# Patient Record
Sex: Male | Born: 1970 | Race: White | Hispanic: No | Marital: Single | Smoking: Former smoker
Health system: Southern US, Community
[De-identification: ages and names within clinical notes are randomized; demographics above are authoritative.]

## PROBLEM LIST (undated history)

## (undated) DIAGNOSIS — F319 Bipolar disorder, unspecified: Secondary | ICD-10-CM

## (undated) HISTORY — PX: HIATAL HERNIA REPAIR: SHX195

---

## 1999-01-15 ENCOUNTER — Ambulatory Visit (HOSPITAL_COMMUNITY): Admission: RE | Admit: 1999-01-15 | Discharge: 1999-01-15 | Payer: Self-pay | Admitting: Gastroenterology

## 2001-03-01 ENCOUNTER — Inpatient Hospital Stay (HOSPITAL_COMMUNITY): Admission: EM | Admit: 2001-03-01 | Discharge: 2001-03-14 | Payer: Self-pay | Admitting: *Deleted

## 2001-03-06 ENCOUNTER — Encounter: Payer: Self-pay | Admitting: *Deleted

## 2001-03-15 ENCOUNTER — Other Ambulatory Visit (HOSPITAL_COMMUNITY): Admission: RE | Admit: 2001-03-15 | Discharge: 2001-03-23 | Payer: Self-pay | Admitting: Psychiatry

## 2003-05-13 ENCOUNTER — Encounter: Payer: Self-pay | Admitting: Emergency Medicine

## 2003-05-13 ENCOUNTER — Emergency Department (HOSPITAL_COMMUNITY): Admission: EM | Admit: 2003-05-13 | Discharge: 2003-05-13 | Payer: Self-pay | Admitting: Emergency Medicine

## 2003-05-17 ENCOUNTER — Inpatient Hospital Stay (HOSPITAL_COMMUNITY): Admission: EM | Admit: 2003-05-17 | Discharge: 2003-05-21 | Payer: Self-pay | Admitting: Psychiatry

## 2007-07-26 ENCOUNTER — Emergency Department (HOSPITAL_COMMUNITY): Admission: EM | Admit: 2007-07-26 | Discharge: 2007-07-26 | Payer: Self-pay | Admitting: *Deleted

## 2011-02-13 NOTE — Discharge Summary (Signed)
NAME:  Charles Ross, Charles Ross                          ACCOUNT NO.:  [id number]   MEDICAL RECORD NO.:  [id number]                   PATIENT TYPE:  IPS   LOCATION:  0404                                 FACILITY:  [facility]   PHYSICIAN:  Jeanice Lim, M.D.              DATE OF BIRTH:  Oct 28, 1970   DATE OF ADMISSION:  05/17/2003  DATE OF DISCHARGE:  05/21/2003                                 DISCHARGE SUMMARY   IDENTIFYING DATA:  This is a 40 year old single Caucasian male involuntarily  committed with a history of  bipolar disorder with symptoms of mania,  grandiosity, anxious, labile with decreased sleep. He had pressured speech,  inappropriate sexual behavior and flight of ideas requiring emergency  sedation.   ADMISSION MEDICATIONS:  Seroquel, Wellbutrin, lithium in the past.   ALLERGIES:  ASPIRIN.   PHYSICAL EXAMINATION:  Within normal limits. Neurologically nonfocal.   LABORATORY DATA:  Routine admission labs were essentially within normal  limits.   MENTAL STATUS EXAM:  The patient was in bed, sedated. He appeared  to be in  no acute distress. He presented with flight of ideas, pressured speech,  grandiosity, hypersexuality, poor judgment and insight  but no acute  suicidal or homicidal ideation.   ADMISSION DIAGNOSES:   AXIS I:  1. Bipolar disorder, hypomanic to manic phase  2. Polysubstance abuse by history.   AXIS II:  Deferred.   AXIS III:  None.   AXIS IV:  Moderate psychosocial problems related to psychiatric illness.   AXIS V:  20/55 to 60.   HOSPITAL COURSE:  The patient was admitted and ordered routine  p.r.n.  medications. He underwent further monitoring. He was encouraged to  participate in individual, group and milieu therapy. The patient required  Ativan and Haldol p.r.n. IM for initial agitation. He was also place on a  nicotine patch for withdrawal symptoms. He was resumed on psychotropics and  lithium was optimized. The patient was resumed  on Lamictal as  well. His  lithium level  was monitored. The patient reported positive response  to  clinical interventions and medication changes.   His condition on discharge was markedly improved. His mood was more stable,  thought process is goal directed, thought content negative for any dangerous  ideation or psychotic symptoms. His judgment and insight had improved. The  patient reported motivation to be compliant with the follow up plan. The  risk benefit ratio and alternative treatments regarding medications were  discussed and side-effects were reviewed on more than one occasion. The  patient was comfortable with the medication regimen.   DISCHARGE MEDICATIONS:  1. Seroquel 200 mg q. h.s.  2. Protonix 40 mg b.i.d.  3. Lamictal 100 mg q. a.m.  4. Lithium carbonate 300 mg 2 b.i.d.  5. Trazodone 100 mg 1 q. h.s.   FOLLOW UP:  The patient was to follow up with Dr. Lafayette Dragon on May 24, 2003,  at 9:45.   DISCHARGE DIAGNOSES:   AXIS I:  1. Bipolar disorder, hypomanic to manic phase  2. Polysubstance abuse by history.   AXIS II:  Deferred.   AXIS III:  None.   AXIS IV:  Moderate psychosocial problems related to psychiatric illness.   AXIS VKallie Locks, M.D.    JEM/MEDQ  D:  06/14/2003  T:  06/16/2003  Job:  [id number]
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
## 2011-02-13 NOTE — H&P (Signed)
[facility]  Patient:    Charles Ross, Charles Ross                       MRN: [id number] Adm. Date:  [id number] Attending:  Jasmine Pang Dictator:   Candi Leash. Orsini, N.P.                   Psychiatric Admission Assessment  IDENTIFYING INFORMATION:  This is a 40 year old single white male involuntarily committed on March 01, 2001 for bipolar disorder, manic episode. Patient considered to be a danger to himself or others.  HISTORY OF PRESENT ILLNESS:  Patient was petitioned as manic with psychotic features, paranoid, experiencing auditory and visual hallucinations, pressured speech and flight of ideas.  Patient reports feeling very anxious about school.  He reports he has been compliant with his medications.  Has been taking his medications "religiously."  He reports he has been up all night. Recently, he feels nothing but positive kindness towards others.  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  He reports also being "mesmerized" by a male patient here at [facility].  He felt there is some attraction.  He went to ejaculate after seeing her, which helped him sleep.  He reports he would rather be having sex or playing basketball at this point in time.  He denies any hallucinations or changes in his appetite, any depression or suicidal ideation.  Patient is very anxious for discharge.  PAST PSYCHIATRIC HISTORY:  Patient does see a psychiatrist apparently but he was unable to provide that name.  He states he is also due for some group therapy with his parents.  They were to have a visit yesterday.  Patient has had hospitalization, 2-3 at [facility] and at [facility] 3-4 times with last hospitalization apparently six months ago.  Patient has a history of bipolar disorder.  SOCIAL HISTORY:  He is a 40 year old single white male.  He has no children. He lives with his  parents.  He also state sometimes he lives with girlfriend. He has been working at [employer] for about 3-1/2 years.  He does everything, he says, from bartending to cleaning walls.  He has completed an associate degree.  He has got a court date upcoming for possession of cocaine.  FAMILY HISTORY:  None that he is aware.  ALCOHOL/DRUG HISTORY:  He has been smoking for years.  Denies alcohol as a problem.  He smokes marijuana and uses cocaine.  PRIMARY CARE Willis Holquin:  Dr. Lorenz Coaster in [place].  MEDICAL PROBLEMS:  None.  MEDICATIONS:  Lithium carbonate.  Patient takes 1500 mg per day.  He has been on that for 10 years.  Takes 600 mg in the morning and 900 mg at night. Seroquel 100 mg at bedtime.  He states that has been effective for him for sleep and he has been compliant with his medications.  DRUG ALLERGIES:  No known drug allergies.  PHYSICAL EXAMINATION:  Performed at [facility].  We will obtain records. Patient does appear as a healthy, young, adult male without complaints.  MENTAL STATUS EXAMINATION:  He is an alert, young, adult white male.  He is casually dressed.  He has fair eye contact.  Patient does have an assortment of colors on in regards to clothing but clothing is appropriate.  Speech is somewhat pressured.  Patient does curse the F-word  fairly often during the interview.  Mood is euphoric.  His affect is tense and irritable.  Thought processes with no current hallucinations.  Patient does have grandiose ideation, positive delusions, flight of ideas, discussing hypersexual matters, no suicidal or homicidal ideation.  Cognitive function is intact.  Oriented x 3.  His memory is fair.  His judgment is impaired.  His insight is poor.  He appears to have average to above average intelligence.  DIAGNOSES: Axis I:    Bipolar, manic with psychotic features. Axis II:   Deferred. Axis III:  None. Axis IV:   Moderate (problems related to primary support  group, education,            other psychosocial problems related to his psychiatric illness). Axis V:    Current 30; estimated this past year 2.  PLAN:  Involuntary commitment for manic behavior.  Patient delusional and hallucinating.  Contract for safety.  Will monitor him every 15 minutes.  Will resume his lithium and Seroquel.  Add Seroquel b.i.d. for her anxiety and irritability.  We will obtain labs and a lithium level.  Our plan is to improve and stabilize his mood and thinking so patient can be safe and to return him to his prior living arrangement.  TENTATIVE LENGTH OF STAY:  Four to five days. DD:  03/01/01 TD:  03/01/01 Job: [id number]

## 2017-11-03 ENCOUNTER — Ambulatory Visit: Payer: BLUE CROSS/BLUE SHIELD | Admitting: Physician Assistant

## 2017-11-03 ENCOUNTER — Other Ambulatory Visit: Payer: Self-pay

## 2017-11-03 ENCOUNTER — Ambulatory Visit (INDEPENDENT_AMBULATORY_CARE_PROVIDER_SITE_OTHER): Payer: BLUE CROSS/BLUE SHIELD

## 2017-11-03 ENCOUNTER — Encounter: Payer: Self-pay | Admitting: Physician Assistant

## 2017-11-03 VITALS — BP 121/80 | HR 72 | Temp 98.2°F | Resp 16 | Ht 73.0 in | Wt 218.0 lb

## 2017-11-03 DIAGNOSIS — R05 Cough: Secondary | ICD-10-CM | POA: Diagnosis not present

## 2017-11-03 DIAGNOSIS — Z87891 Personal history of nicotine dependence: Secondary | ICD-10-CM

## 2017-11-03 DIAGNOSIS — R062 Wheezing: Secondary | ICD-10-CM

## 2017-11-03 DIAGNOSIS — F17200 Nicotine dependence, unspecified, uncomplicated: Secondary | ICD-10-CM

## 2017-11-03 DIAGNOSIS — R059 Cough, unspecified: Secondary | ICD-10-CM

## 2017-11-03 MED ORDER — IPRATROPIUM BROMIDE 0.02 % IN SOLN
0.5000 mg | Freq: Once | RESPIRATORY_TRACT | Status: AC
  Administered 2017-11-03: 0.5 mg via RESPIRATORY_TRACT

## 2017-11-03 MED ORDER — DOXYCYCLINE HYCLATE 100 MG PO CAPS: 100.0000 mg | ORAL_CAPSULE | Freq: Two times a day (BID) | ORAL | 0 refills | Status: AC

## 2017-11-03 MED ORDER — ALBUTEROL SULFATE (2.5 MG/3ML) 0.083% IN NEBU
2.5000 mg | INHALATION_SOLUTION | Freq: Once | RESPIRATORY_TRACT | Status: AC
  Administered 2017-11-03: 2.5 mg via RESPIRATORY_TRACT

## 2017-11-03 MED ORDER — ALBUTEROL SULFATE HFA 108 (90 BASE) MCG/ACT IN AERS: 2.0000 | INHALATION_SPRAY | Freq: Four times a day (QID) | RESPIRATORY_TRACT | 0 refills | Status: AC | PRN

## 2017-11-03 NOTE — Progress Notes (Signed)
11/03/2017 4:58 PM   DOB: 04/20/1971 / MRN: [id number]  SUBJECTIVE:  Charles Ross is a 47 y.o. male presenting for cough, nasal congestion, sore throat.  Symptoms started about 12 days ago.  Today's the first day he is started to feel that he is getting better.  He has a 5-pack-year history of smoking over the last 20 years.  He associates his sinuses, behind his eyes and has been having some maxillary teeth pain mostly on the right side.  He has no allergies on file.   He  has no past medical history on file.    He  reports that he has quit smoking. he has never used smokeless tobacco. He reports that he drinks alcohol. He reports that he does not use drugs. He  has no sexual activity history on file. The patient  has no past surgical history on file.  His family history is not on file.  Review of Systems  Constitutional: Negative for chills, diaphoresis and fever.  Respiratory: Positive for cough. Negative for hemoptysis, sputum production, shortness of breath and wheezing.   Cardiovascular: Negative for chest pain, orthopnea and leg swelling.  Gastrointestinal: Negative for nausea.  Skin: Negative for rash.  Neurological: Negative for dizziness.    The problem list and medications were reviewed and updated by myself where necessary and exist elsewhere in the encounter.   OBJECTIVE:  BP 121/80 (BP Location: Right Arm, Patient Position: Sitting, Cuff Size: Normal)   Pulse 72   Temp 98.2 F (36.8 C) (Oral)   Resp 16   Ht 6\' 1"  (1.854 m)   Wt 218 lb (98.9 kg)   SpO2 96%   BMI 28.76 kg/m   Physical Exam  Constitutional: He appears well-developed. He is active and cooperative.  Non-toxic appearance.  HENT:  Head: Normocephalic and atraumatic.  Right Ear: Tympanic membrane normal.  Left Ear: Tympanic membrane normal.  Nose: Nose normal. Right sinus exhibits no maxillary sinus tenderness and no frontal sinus tenderness. Left sinus exhibits no maxillary sinus tenderness and  no frontal sinus tenderness.  Mouth/Throat: Uvula is midline and oropharynx is clear and moist.  Cardiovascular: Normal rate, regular rhythm, S1 normal, S2 normal, normal heart sounds, intact distal pulses and normal pulses. Exam reveals no gallop and no friction rub.  No murmur heard. Pulmonary/Chest: Effort normal. No stridor. No tachypnea. No respiratory distress. He has no wheezes. He has no rales.  Abdominal: He exhibits no distension.  Musculoskeletal: He exhibits no edema.  Neurological: He is alert.  Skin: Skin is warm and dry. He is not diaphoretic. No pallor.  Vitals reviewed.   No results found for this or any previous visit (from the past 72 hour(s)).  Dg Chest 2 View  Result Date: 11/03/2017 CLINICAL DATA:  Cough and wheezing.  Smoker EXAM: CHEST  2 VIEW COMPARISON:  None. FINDINGS: The heart size and mediastinal contours are within normal limits. Both lungs are clear. The visualized skeletal structures are unremarkable. IMPRESSION: No active cardiopulmonary disease. Electronically Signed   By: Marlan Palau M.D.   On: 11/03/2017 16:39    ASSESSMENT AND PLAN:  Oracio was seen today for uri.  Diagnoses and all orders for this visit:  Wheezing: Given wheezing, history of smoking, prolonged cough associated with what sounds like a virus this is most likely reactive airway versus a very early COPD-like reaction.  This started on doxycycline to cover for an atypical.  I would like to give him a little prednisone  but no labs exist in the system.  I will bring him back in a few weeks for an annual physical.  Smoker -     albuterol (PROVENTIL) (2.5 MG/3ML) 0.083% nebulizer solution 2.5 mg -     ipratropium (ATROVENT) nebulizer solution 0.5 mg -     DG Chest 2 View; Future  Cough -     doxycycline (VIBRAMYCIN) 100 MG capsule; Take 1 capsule (100 mg total) by mouth 2 (two) times daily for 10 days.    The patient is advised to call or return to clinic if he does not see an  improvement in symptoms, or to seek the care of the closest emergency department if he worsens with the above plan.   Deliah BostonMichael Cordney Barstow, MHS, PA-C Primary Care at [facility] 11/03/2017 4:58 PM

## 2017-11-03 NOTE — Patient Instructions (Addendum)
Go ahead and take the albuterol every 4-6 hours as needed.  I started you on doxycycline which is a broad-spectrum antibiotic.  He can take Tylenol 1000 mg every 8 hours for any aches pains and fevers.  Chest x-ray is normal today.  Please come back in a week or 2 to talk about your rash and I would also like for you to formulate a plan to quit smoking.   IF you received an x-ray today, you will receive an invoice from [facility]. Please contact [facility] at [phone] with questions or concerns regarding your invoice.   IF you received labwork today, you will receive an invoice from [facility]. Please contact [facility] at [phone] with questions or concerns regarding your invoice.   Our billing staff will not be able to assist you with questions regarding bills from these companies.  You will be contacted with the lab results as soon as they are available. The fastest way to get your results is to activate your My Chart account. Instructions are located on the last page of this paperwork. If you have not heard from us regarding the results in 2 weeks, please contact this office.

## 2017-11-04 ENCOUNTER — Telehealth: Payer: Self-pay

## 2017-11-04 NOTE — Telephone Encounter (Signed)
Copied from CRM [id number]. Topic: Inquiry >> Nov 04, 2017  2:26 PM Yvonna Alanisobinson, Andra M wrote: Reason for CRM: Patient called upset over his prescription costs. I advised the patient that a message has been sent earlier today. The patient wants Deliah BostonMichael Clark to take care of this medication issue today. I advised the patient that I would request a call back for him today.       Thank You!!!

## 2017-11-04 NOTE — Telephone Encounter (Signed)
Copied from CRM [phone]. Topic: Inquiry >> Nov 04, 2017 12:06 PM Yvonna Alanisobinson, Andra M wrote: Reason for CRM: [place]wood [place]ette [place] [phone]) called on behalf of the patient requesting a cheaper inhaler than the Albuterol (PROVENTIL HFA;VENTOLIN HFA) 108 (90 Base) MCG/ACT inhaler if possible. Patient stated that he cannot not afford Albuterol (PROVENTIL HFA;VENTOLIN HFA) 108 (90 Base) MCG/ACT inhaler as it has a $90.00 co-payment. Lynette [facility] requests a different inhaler for the patient that would be cheaper. Please call the patient today at [phone].       Thank You!!!  >> Nov 04, 2017  1:33 PM Terisa Starraylor, Brittany L wrote: Patient said he contacted [employer] and was on hold for a hour. Please call patient as soon as you change the med.

## 2017-11-04 NOTE — Telephone Encounter (Signed)
Patient presents in clinic to request alternate to albuterol. He is adamant this is resolved before the end of the business day. RN will resolve per patient request.  Phone call to pharmacy. Spoke with pharmacist - requested generic for medication (medication does not need to be dispensed as written per epic order). Should be 20 dollars out of pocket vs 90 dollars.  Phone call to patient. Left detailed message per release scanned today medication alternative has been called in with approximate cost of 20 dollars out of pocket. If patient calls back, please relay this to him.

## 2018-01-28 ENCOUNTER — Other Ambulatory Visit: Payer: Self-pay | Admitting: Physician Assistant

## 2018-01-28 DIAGNOSIS — R059 Cough, unspecified: Secondary | ICD-10-CM

## 2018-01-28 DIAGNOSIS — R05 Cough: Secondary | ICD-10-CM

## 2018-04-08 ENCOUNTER — Emergency Department
Admission: EM | Admit: 2018-04-08 | Discharge: 2018-04-08 | Disposition: A | Discharge status: Home / Self Care | Payer: BLUE CROSS/BLUE SHIELD | Attending: Emergency Medicine | Admitting: Emergency Medicine

## 2018-04-08 ENCOUNTER — Other Ambulatory Visit: Payer: Self-pay

## 2018-04-08 ENCOUNTER — Encounter: Payer: Self-pay | Admitting: Emergency Medicine

## 2018-04-08 ENCOUNTER — Emergency Department: Payer: Self-pay

## 2018-04-08 DIAGNOSIS — Z79899 Other long term (current) drug therapy: Secondary | ICD-10-CM | POA: Insufficient documentation

## 2018-04-08 DIAGNOSIS — K529 Noninfective gastroenteritis and colitis, unspecified: Secondary | ICD-10-CM | POA: Insufficient documentation

## 2018-04-08 DIAGNOSIS — Z87891 Personal history of nicotine dependence: Secondary | ICD-10-CM | POA: Insufficient documentation

## 2018-04-08 DIAGNOSIS — R140 Abdominal distension (gaseous): Secondary | ICD-10-CM | POA: Insufficient documentation

## 2018-04-08 LAB — COMPREHENSIVE METABOLIC PANEL
ALT: 45 U/L — ABNORMAL HIGH (ref 0–44)
AST: 29 U/L (ref 15–41)
Albumin: 4.3 g/dL (ref 3.5–5.0)
Alkaline Phosphatase: 59 U/L (ref 38–126)
Anion gap: 9 (ref 5–15)
BUN: 8 mg/dL (ref 6–20)
CO2: 21 mmol/L — ABNORMAL LOW (ref 22–32)
Calcium: 9.7 mg/dL (ref 8.9–10.3)
Chloride: 107 mmol/L (ref 98–111)
Creatinine, Ser: 0.75 mg/dL (ref 0.61–1.24)
GFR calc Af Amer: >60 mL/min (ref >60)
GFR calc non Af Amer: >60 mL/min (ref >60)
Glucose, Bld: 144 mg/dL — ABNORMAL HIGH (ref 70–99)
Potassium: 3.5 mmol/L (ref 3.5–5.1)
Sodium: 137 mmol/L (ref 135–145)
Total Bilirubin: 0.7 mg/dL (ref 0.3–1.2)
Total Protein: 7.3 g/dL (ref 6.5–8.1)

## 2018-04-08 LAB — CBC
HCT: 37.6 % — ABNORMAL LOW (ref 40.0–52.0)
Hemoglobin: 13.4 g/dL (ref 13.0–18.0)
MCH: 33.4 pg (ref 26.0–34.0)
MCHC: 35.5 g/dL (ref 32.0–36.0)
MCV: 94.2 fL (ref 80.0–100.0)
Platelets: 204 10*3/uL (ref 150–440)
RBC: 4 MIL/uL — ABNORMAL LOW (ref 4.40–5.90)
RDW: 13.5 % (ref 11.5–14.5)
WBC: 5.8 10*3/uL (ref 3.8–10.6)

## 2018-04-08 LAB — URINALYSIS, COMPLETE (UACMP) WITH MICROSCOPIC
Bacteria, UA: NONE SEEN
Bilirubin Urine: NEGATIVE
Glucose, UA: NEGATIVE mg/dL
Hgb urine dipstick: NEGATIVE
Ketones, ur: NEGATIVE mg/dL
Leukocytes, UA: NEGATIVE
Nitrite: NEGATIVE
Protein, ur: NEGATIVE mg/dL
Specific Gravity, Urine: 1.003 — ABNORMAL LOW (ref 1.005–1.030)
Squamous Epithelial / LPF: NONE SEEN (ref 0–5)
pH: 7 (ref 5.0–8.0)

## 2018-04-08 LAB — LIPASE, BLOOD: Lipase: 109 U/L — ABNORMAL HIGH (ref 11–51)

## 2018-04-08 MED ORDER — IOHEXOL 300 MG/ML  SOLN
100.0000 mL | Freq: Once | INTRAMUSCULAR | Status: AC | PRN
  Administered 2018-04-08: 100 mL via INTRAVENOUS

## 2018-04-08 MED ORDER — CIPROFLOXACIN HCL 500 MG PO TABS: 500.0000 mg | ORAL_TABLET | Freq: Two times a day (BID) | ORAL | 0 refills | Status: AC

## 2018-04-08 MED ORDER — SODIUM CHLORIDE 0.9 % IV BOLUS
1000.0000 mL | Freq: Once | INTRAVENOUS | Status: AC
  Administered 2018-04-08: 1000 mL via INTRAVENOUS

## 2018-04-08 MED ORDER — PROCHLORPERAZINE EDISYLATE 10 MG/2ML IJ SOLN
10.0000 mg | Freq: Once | INTRAMUSCULAR | Status: AC
  Administered 2018-04-08: 10 mg via INTRAVENOUS
  Filled 2018-04-08: qty 2

## 2018-04-08 MED ORDER — MIDAZOLAM HCL 5 MG/5ML IJ SOLN
5.0000 mg | Freq: Once | INTRAMUSCULAR | Status: AC
  Administered 2018-04-08: 5 mg via INTRAVENOUS
  Filled 2018-04-08: qty 5

## 2018-04-08 NOTE — Consult Note (Signed)
Patient ID: Charles Ross, male   DOB: 1970/10/06, 47 y.o.   MRN: [id number]  HPI Charles SettleMichael S Freeburg is a 47 y.o. male asked to see in consultation by Dr. Lamont Snowballifenbark.  He reports about 10-day history of diarrhea and abdominal cramping, patient reports some mild intermittent cramping pain that is diffuse nonradiating.Marland Kitchen.  He also reports significant abdominal distention and nausea.  No fevers no chills.  He has been having multiple watery bowel movements for a week to 10 days.  No previous abdominal operations. Including white count and BMP completely normal.  CT scan personally reviewed there is some mildly dilated loops of small bowel with no evidence of transition zone.  No evidence of free air or pneumatosis. Does report some w weakness and dizziness. He is able to  perform more than 4 METS of activity without any shortness of breath or chest pain He was told he had a hiatal hernia at some point time  HPI  History reviewed. No pertinent past medical history. No pertinent fam hx No surgical hx   No family history on file.  Social History Social History   Tobacco Use  . Smoking status: Former Games developermoker  . Smokeless tobacco: Never Used  Substance Use Topics  . Alcohol use: Yes    Comment: occasional  . Drug use: No    Allergies  Allergen Reactions  . Aspirin     No current facility-administered medications for this encounter.    Current Outpatient Medications  Medication Sig Dispense Refill  . albuterol (PROVENTIL HFA;VENTOLIN HFA) 108 (90 Base) MCG/ACT inhaler Inhale 2 puffs into the lungs every 6 (six) hours as needed for wheezing or shortness of breath. 1 Inhaler 0  . buPROPion (WELLBUTRIN XL) 150 MG 24 hr tablet Take 150 mg by mouth daily.    Marland Kitchen. lithium 600 MG capsule Take 600 mg by mouth 2 (two) times daily with a meal.    . omeprazole (PRILOSEC OTC) 20 MG tablet Take 20 mg by mouth 2 (two) times daily.    . QUEtiapine (SEROQUEL XR) 300 MG 24 hr tablet Take 300 mg by mouth at  bedtime.       Review of Systems Full ROS  was asked and was negative except for the information on the HPI  Physical Exam Blood pressure (!) 157/98, pulse 99, temperature 98.4 F (36.9 C), temperature source Oral, resp. rate 20, height 6\' 1"  (1.854 m), weight 99.8 kg (220 lb), SpO2 97 %. CONSTITUTIONAL: NAD EYES: Pupils are equal, round, and reactive to light, Sclera are non-icteric. EARS, NOSE, MOUTH AND THROAT: The oropharynx is clear. The oral mucosa is pink and moist. Hearing is intact to voice. LYMPH NODES:  Lymph nodes in the neck are normal. RESPIRATORY:  Lungs are clear. There is normal respiratory effort, with equal breath sounds bilaterally, and without pathologic use of accessory muscles. CARDIOVASCULAR: Heart is regular without murmurs, gallops, or rubs. GI: The abdomen is  soft, nontender, and distended. There are no palpable masses. There is no hepatosplenomegaly. There are normal bowel sounds in all quadrants. No peritonitis , reducible small UH GU: Rectal deferred.   MUSCULOSKELETAL: Normal muscle strength and tone. No cyanosis or edema.   SKIN: Turgor is good and there are no pathologic skin lesions or ulcers. NEUROLOGIC: Motor and sensation is grossly normal. Cranial nerves are grossly intact. PSYCH:  Oriented to person, place and time. Affect is normal.  Data Reviewed  I have personally reviewed the patient's imaging, laboratory findings and medical  records.    Assessment/ Plan  47 year old with nausea vomiting, diarrhea consistent with enteritis.  No evidence of bowel obstruction.  No need for emergent surgical intervention at this time.  I do recommend crystalloid resuscitation and symptomatic control.  He may be admitted to the medical service if unable to be hydrated by mouth. Findings were discussed with the patient detail and with the ER physician   Sterling Big, MD FACS General Surgeon 04/08/2018, 5:50 PM

## 2018-04-08 NOTE — ED Notes (Signed)
Patient walked outside.  Told police officer amy that he was dizzy, but he would not get in a wheel chair.  He came in with her and sat in waiting room.  In nad.

## 2018-04-08 NOTE — ED Provider Notes (Signed)
[facility] Emergency Department Provider Note  ____________________________________________   First MD Initiated Contact with Patient 04/08/18 1532     (approximate)  I have reviewed the triage vital signs and the nursing notes.   HISTORY  Chief Complaint No chief complaint on file.   HPI Weston SettleMichael S Sustaita is a 47 y.o. male who self presents to the emergency department with roughly 10 days of loose stools and increasing abdominal distention.  His symptoms had gradual onset have been slowly progressive are now moderate severity.  He has moderate severity diffuse cramping abdominal pain worse when defecating improved thereafter.  No nausea or vomiting.  He does have a remote history of a hiatal hernia repair.  He denies recent antibiotic use.  He denies fevers or chills.  He has not left the country recently.  He has never had a colonoscopy.      History reviewed. No pertinent past medical history.  Patient Active Problem List   Diagnosis Date Noted  . Enteritis       Prior to Admission medications   Medication Sig Start Date End Date Taking? Authorizing Provider  albuterol (PROVENTIL HFA;VENTOLIN HFA) 108 (90 Base) MCG/ACT inhaler Inhale 2 puffs into the lungs every 6 (six) hours as needed for wheezing or shortness of breath. 11/03/17  Yes Ofilia Neaslark, Juluis L, PA-C  buPROPion (WELLBUTRIN XL) 150 MG 24 hr tablet Take 150 mg by mouth daily.   Yes [provider]  lithium 600 MG capsule Take 600 mg by mouth 2 (two) times daily with a meal.   Yes [provider]  omeprazole (PRILOSEC OTC) 20 MG tablet Take 20 mg by mouth 2 (two) times daily.   Yes [provider]  QUEtiapine (SEROQUEL XR) 300 MG 24 hr tablet Take 300 mg by mouth at bedtime.   Yes [provider]  ciprofloxacin (CIPRO) 500 MG tablet Take 1 tablet (500 mg total) by mouth 2 (two) times daily for 3 days. 04/08/18 04/11/18  Merrily Brittleifenbark, Taneisha Fuson, MD     Allergies Aspirin  No family history on file.  Social History Social History   Tobacco Use  . Smoking status: Former Games developermoker  . Smokeless tobacco: Never Used  Substance Use Topics  . Alcohol use: Yes    Comment: occasional  . Drug use: No    Review of Systems Constitutional: No fever/chills Eyes: No visual changes. ENT: No sore throat. Cardiovascular: Denies chest pain. Respiratory: Denies shortness of breath. Gastrointestinal: Positive for abdominal pain.  No nausea, no vomiting.  Positive for diarrhea.  No constipation. Genitourinary: Negative for dysuria. Musculoskeletal: Negative for back pain. Skin: Negative for rash. Neurological: Negative for headaches, focal weakness or numbness.   ____________________________________________   PHYSICAL EXAM:  VITAL SIGNS: ED Triage Vitals  Enc Vitals Group     BP 04/08/18 1410 133/88     Pulse Rate 04/08/18 1410 (!) 104     Resp 04/08/18 1410 16     Temp 04/08/18 1410 98.4 F (36.9 C)     Temp Source 04/08/18 1410 Oral     SpO2 04/08/18 1410 96 %     Weight 04/08/18 1408 220 lb (99.8 kg)     Height 04/08/18 1408 6\' 1"  (1.854 m)     Head Circumference --      Peak Flow --      Pain Score 04/08/18 1408 8     Pain Loc --      Pain Edu? --  Excl. in GC? --     Constitutional: Alert and oriented x4 appears obviously uncomfortable nontoxic no diaphoresis speaks focally sentences Eyes: PERRL EOMI. Head: Atraumatic. Nose: No congestion/rhinnorhea. Mouth/Throat: No trismus Neck: No stridor.   Cardiovascular: Normal rate, regular rhythm. Grossly normal heart sounds.  Good peripheral circulation. Respiratory: Normal respiratory effort.  No retractions. Lungs CTAB and moving good air Gastrointestinal: Soft abdomen distended although not tense mild diffuse tenderness no rebound or guarding no peritonitis hyperactive bowel sounds Musculoskeletal: No lower extremity edema   Neurologic:  Normal speech and language.  No gross focal neurologic deficits are appreciated. Skin:  Skin is warm, dry and intact. No rash noted. Psychiatric: Mood and affect are normal. Speech and behavior are normal.    ____________________________________________   DIFFERENTIAL includes but not limited to  Small bowel obstruction, large bowel obstruction, volvulus, infectious diarrhea, inflammatory diarrhea ____________________________________________   LABS (all labs ordered are listed, but only abnormal results are displayed)  Labs Reviewed  LIPASE, BLOOD - Abnormal; Notable for the following components:      Result Value   Lipase 109 (*)    All other components within normal limits  COMPREHENSIVE METABOLIC PANEL - Abnormal; Notable for the following components:   CO2 21 (*)    Glucose, Bld 144 (*)    ALT 45 (*)    All other components within normal limits  CBC - Abnormal; Notable for the following components:   RBC 4.00 (*)    HCT 37.6 (*)    All other components within normal limits  URINALYSIS, COMPLETE (UACMP) WITH MICROSCOPIC - Abnormal; Notable for the following components:   Color, Urine STRAW (*)    APPearance CLEAR (*)    Specific Gravity, Urine 1.003 (*)    All other components within normal limits    Lab work reviewed by me with elevated lipase but not consistent with pancreatitis __________________________________________  EKG   ____________________________________________  RADIOLOGY  CT abdomen pelvis reviewed by me consistent with small bowel obstruction versus enteritis ____________________________________________   PROCEDURES  Procedure(s) performed: no  Procedures  Critical Care performed: no  ____________________________________________   INITIAL IMPRESSION / ASSESSMENT AND PLAN / ED COURSE  Pertinent labs & imaging results that were available during my care of the patient were reviewed by me and considered in my medical decision making (see chart for details).   The  patient arrives obviously uncomfortable appearing with a distended abdomen and diarrhea for 10 days.  He requires a CT scan with IV contrast.  IV fentanyl for pain in the meantime.    ----------------------------------------- 5:37 PM on 04/08/2018 -----------------------------------------  I personally attempted to place the NG tube after Versed anxiolysis and the patient was unable to tolerate.  I placed the NG tube completely down however he subsequently ripped it out and refused replacement.  Dr. Everlene Farrier is currently at bedside and feels this most likely represents enteritis and small bowel obstruction.  I offered the patient inpatient admission versus outpatient management and he would prefer to go home so we will treat him symptomatically with 3 days of ciprofloxacin and refer him to gastroenterology.  He is able to eat and drink.  Strict return precautions have been given and the patient verbalizes understanding and agreement with the plan.  ____________________________________________   FINAL CLINICAL IMPRESSION(S) / ED DIAGNOSES  Final diagnoses:  Enteritis      NEW MEDICATIONS STARTED DURING THIS VISIT:  New Prescriptions   CIPROFLOXACIN (CIPRO) 500 MG TABLET    Take  1 tablet (500 mg total) by mouth 2 (two) times daily for 3 days.     Note:  This document was prepared using Dragon voice recognition software and may include unintentional dictation errors.     Merrily Brittle, MD 04/08/18 1827

## 2018-04-08 NOTE — ED Notes (Signed)
Attempted to place NG tube without success. Amber, RN also attempted without success and Dr. Lamont Snowballifenbark was called into the room to assist, placement was unsuccessful.

## 2018-04-08 NOTE — ED Triage Notes (Signed)
C/O diarrhea x 1 week.  Also c/o heartburn -- has been taking prilosec.  Also c/o abdominal distention x 1 week.  AAOx3.  Skin warm and dry.  NAD

## 2018-04-08 NOTE — Discharge Instructions (Signed)
Please take all of your antibiotics as prescribed and make an appointment to follow-up with the gastroenterologist this coming week for reevaluation.  Make sure you remain well-hydrated and return to the emergency department for any concerns.  It was a pleasure to take care of you today, and thank you for coming to our emergency department.  If you have any questions or concerns before leaving please ask the nurse to grab me and I'm more than happy to go through your aftercare instructions again.  If you were prescribed any opioid pain medication today such as Norco, Vicodin, Percocet, morphine, hydrocodone, or oxycodone please make sure you do not drive when you are taking this medication as it can alter your ability to drive safely.  If you have any concerns once you are home that you are not improving or are in fact getting worse before you can make it to your follow-up appointment, please do not hesitate to call 911 and come back for further evaluation.  Merrily BrittleNeil Elenie Coven, MD  Results for orders placed or performed during the hospital encounter of 04/08/18  Lipase, blood  Result Value Ref Range   Lipase 109 (H) 11 - 51 U/L  Comprehensive metabolic panel  Result Value Ref Range   Sodium 137 135 - 145 mmol/L   Potassium 3.5 3.5 - 5.1 mmol/L   Chloride 107 98 - 111 mmol/L   CO2 21 (L) 22 - 32 mmol/L   Glucose, Bld 144 (H) 70 - 99 mg/dL   BUN 8 6 - 20 mg/dL   Creatinine, Ser 9.560.75 0.61 - 1.24 mg/dL   Calcium 9.7 8.9 - 21.310.3 mg/dL   Total Protein 7.3 6.5 - 8.1 g/dL   Albumin 4.3 3.5 - 5.0 g/dL   AST 29 15 - 41 U/L   ALT 45 (H) 0 - 44 U/L   Alkaline Phosphatase 59 38 - 126 U/L   Total Bilirubin 0.7 0.3 - 1.2 mg/dL   GFR calc non Af Amer >60 >60 mL/min   GFR calc Af Amer >60 >60 mL/min   Anion gap 9 5 - 15  CBC  Result Value Ref Range   WBC 5.8 3.8 - 10.6 K/uL   RBC 4.00 (L) 4.40 - 5.90 MIL/uL   Hemoglobin 13.4 13.0 - 18.0 g/dL   HCT 08.637.6 (L) 57.840.0 - 46.952.0 %   MCV 94.2 80.0 - 100.0 fL   MCH 33.4 26.0 - 34.0 pg   MCHC 35.5 32.0 - 36.0 g/dL   RDW 62.913.5 52.811.5 - 41.314.5 %   Platelets 204 150 - 440 K/uL  Urinalysis, Complete w Microscopic  Result Value Ref Range   Color, Urine STRAW (A) YELLOW   APPearance CLEAR (A) CLEAR   Specific Gravity, Urine 1.003 (L) 1.005 - 1.030   pH 7.0 5.0 - 8.0   Glucose, UA NEGATIVE NEGATIVE mg/dL   Hgb urine dipstick NEGATIVE NEGATIVE   Bilirubin Urine NEGATIVE NEGATIVE   Ketones, ur NEGATIVE NEGATIVE mg/dL   Protein, ur NEGATIVE NEGATIVE mg/dL   Nitrite NEGATIVE NEGATIVE   Leukocytes, UA NEGATIVE NEGATIVE   WBC, UA 0-5 0 - 5 WBC/hpf   Bacteria, UA NONE SEEN NONE SEEN   Squamous Epithelial / LPF NONE SEEN 0 - 5   Mucus PRESENT    Ct Abdomen Pelvis W Contrast  Result Date: 04/08/2018 CLINICAL DATA:  Diarrhea and abdominal pain for 10 days. EXAM: CT ABDOMEN AND PELVIS WITH CONTRAST TECHNIQUE: Multidetector CT imaging of the abdomen and pelvis was performed using the  standard protocol following bolus administration of intravenous contrast. CONTRAST:  OMNIPAQUE IOHEXOL 300 MG/ML  SOLN COMPARISON:  None FINDINGS: Lower chest: No acute abnormality. Hepatobiliary: No focal liver abnormality is seen. No gallstones, gallbladder wall thickening, or biliary dilatation. Pancreas: Unremarkable. Spleen: The spleen measures 13.3 by 5.9 x 13.4 cm (volume = 550 cm^3) Adrenals/Urinary Tract: Normal appearance of the adrenal glands. Bilateral areas of renal cortical scarring are identified, left greater than right. No mass or hydronephrosis noted. The urinary bladder appears normal. Stomach/Bowel: The stomach is normal. The mid small bowel loops are mildly dilated measuring up to 3 cm. Associated small bowel fluid levels are identified. Transition to normal caliber distal small bowel loops noted in the right lower quadrant of the abdomen, image 43/5. The appendix is visualized and is normal. No pathologic dilatation of the colon. Vascular/Lymphatic: Aortic  atherosclerosis. No aneurysm. No abdominal or pelvic adenopathy. Reproductive: Prostate is unremarkable. Other: No free fluid or fluid collections. Musculoskeletal: No acute or significant osseous findings. IMPRESSION: 1. Exam is positive for mildly dilated loops of mid small bowel with air-fluid levels. Findings may represent low-grade small bowel obstruction versus sequelae of enteritis. No evidence for bowel perforation. 2. Splenomegaly. 3.  Aortic Atherosclerosis (ICD10-I70.0). Electronically Signed   By: Signa Kell M.D.   On: 04/08/2018 16:29

## 2018-04-10 ENCOUNTER — Other Ambulatory Visit: Payer: Self-pay

## 2018-04-10 ENCOUNTER — Emergency Department
Admission: EM | Admit: 2018-04-10 | Discharge: 2018-04-10 | Disposition: A | Discharge status: Home / Self Care | Payer: Self-pay | Attending: Emergency Medicine | Admitting: Emergency Medicine

## 2018-04-10 DIAGNOSIS — Z5321 Procedure and treatment not carried out due to patient leaving prior to being seen by health care provider: Secondary | ICD-10-CM | POA: Insufficient documentation

## 2018-04-10 DIAGNOSIS — Z760 Encounter for issue of repeat prescription: Secondary | ICD-10-CM | POA: Insufficient documentation

## 2018-04-10 NOTE — ED Notes (Signed)
See triage note  States he is requesting seroquel    States he has been out for a few days  Not able to sleep  Denies any other sxs'

## 2018-04-10 NOTE — ED Notes (Signed)
Pt not in room - pt left

## 2018-04-10 NOTE — ED Triage Notes (Signed)
Pt states he is out of seroquel to sleep at night. States ran out 4-5 days ago. [facility] usually prescribes it for him. States he has been in the middle of a move. Alert, oriented, ambulatory.

## 2018-04-11 ENCOUNTER — Telehealth: Payer: Self-pay | Admitting: Gastroenterology

## 2018-04-11 NOTE — Telephone Encounter (Signed)
Attempted to call pt to schedule ED fu with Dr. Allegra LaiVanga no vm set up

## 2018-04-21 ENCOUNTER — Telehealth: Payer: Self-pay | Admitting: Gastroenterology

## 2018-04-21 NOTE — Telephone Encounter (Signed)
No vm set up attempted to call pt to schedule Ed fu per note with Dr. Allegra LaiVanga

## 2018-05-23 ENCOUNTER — Encounter (HOSPITAL_COMMUNITY): Payer: Self-pay | Admitting: *Deleted

## 2018-05-23 ENCOUNTER — Emergency Department (HOSPITAL_COMMUNITY)
Admission: EM | Admit: 2018-05-23 | Discharge: 2018-05-24 | Disposition: A | Discharge status: Home / Self Care | Payer: BLUE CROSS/BLUE SHIELD | Attending: Emergency Medicine | Admitting: Emergency Medicine

## 2018-05-23 DIAGNOSIS — Z79899 Other long term (current) drug therapy: Secondary | ICD-10-CM | POA: Diagnosis not present

## 2018-05-23 DIAGNOSIS — G4700 Insomnia, unspecified: Secondary | ICD-10-CM | POA: Diagnosis present

## 2018-05-23 DIAGNOSIS — K529 Noninfective gastroenteritis and colitis, unspecified: Secondary | ICD-10-CM | POA: Diagnosis not present

## 2018-05-23 DIAGNOSIS — Z87891 Personal history of nicotine dependence: Secondary | ICD-10-CM | POA: Diagnosis not present

## 2018-05-23 DIAGNOSIS — F319 Bipolar disorder, unspecified: Secondary | ICD-10-CM | POA: Diagnosis present

## 2018-05-23 DIAGNOSIS — F3010 Manic episode without psychotic symptoms, unspecified: Secondary | ICD-10-CM

## 2018-05-23 DIAGNOSIS — Z818 Family history of other mental and behavioral disorders: Secondary | ICD-10-CM | POA: Diagnosis not present

## 2018-05-23 DIAGNOSIS — F3110 Bipolar disorder, current episode manic without psychotic features, unspecified: Secondary | ICD-10-CM | POA: Diagnosis not present

## 2018-05-23 HISTORY — DX: Bipolar disorder, unspecified: F31.9

## 2018-05-23 LAB — CBC WITH DIFFERENTIAL/PLATELET
Basophils Absolute: 0 10*3/uL (ref 0.0–0.1)
Basophils Relative: 0 %
Eosinophils Absolute: 0.4 10*3/uL (ref 0.0–0.7)
Eosinophils Relative: 5 %
HCT: 41 % (ref 39.0–52.0)
Hemoglobin: 13.9 g/dL (ref 13.0–17.0)
Lymphocytes Relative: 22 %
Lymphs Abs: 1.8 10*3/uL (ref 0.7–4.0)
MCH: 32 pg (ref 26.0–34.0)
MCHC: 33.9 g/dL (ref 30.0–36.0)
MCV: 94.3 fL (ref 78.0–100.0)
Monocytes Absolute: 0.8 10*3/uL (ref 0.1–1.0)
Monocytes Relative: 10 %
Neutro Abs: 5.2 10*3/uL (ref 1.7–7.7)
Neutrophils Relative %: 63 %
Platelets: 252 10*3/uL (ref 150–400)
RBC: 4.35 MIL/uL (ref 4.22–5.81)
RDW: 13 % (ref 11.5–15.5)
WBC: 8.2 10*3/uL (ref 4.0–10.5)

## 2018-05-23 LAB — COMPREHENSIVE METABOLIC PANEL
ALT: 55 U/L — ABNORMAL HIGH (ref 0–44)
AST: 33 U/L (ref 15–41)
Albumin: 4.5 g/dL (ref 3.5–5.0)
Alkaline Phosphatase: 60 U/L (ref 38–126)
Anion gap: 9 (ref 5–15)
BUN: 5 mg/dL — ABNORMAL LOW (ref 6–20)
CO2: 23 mmol/L (ref 22–32)
Calcium: 9.5 mg/dL (ref 8.9–10.3)
Chloride: 109 mmol/L (ref 98–111)
Creatinine, Ser: 0.7 mg/dL (ref 0.61–1.24)
GFR calc Af Amer: >60 mL/min (ref >60)
GFR calc non Af Amer: >60 mL/min (ref >60)
Glucose, Bld: 121 mg/dL — ABNORMAL HIGH (ref 70–99)
Potassium: 3.9 mmol/L (ref 3.5–5.1)
Sodium: 141 mmol/L (ref 135–145)
Total Bilirubin: 0.7 mg/dL (ref 0.3–1.2)
Total Protein: 7 g/dL (ref 6.5–8.1)

## 2018-05-23 LAB — LITHIUM LEVEL: Lithium Lvl: 0.17 mmol/L — ABNORMAL LOW (ref 0.60–1.20)

## 2018-05-23 LAB — RAPID URINE DRUG SCREEN, HOSP PERFORMED
Amphetamines: NOT DETECTED
Barbiturates: NOT DETECTED
Benzodiazepines: NOT DETECTED
Cocaine: NOT DETECTED
Opiates: NOT DETECTED
Tetrahydrocannabinol: POSITIVE — AB

## 2018-05-23 LAB — TSH: TSH: 0.661 u[IU]/mL (ref 0.350–4.500)

## 2018-05-23 LAB — ETHANOL: Alcohol, Ethyl (B): <10 mg/dL (ref <10)

## 2018-05-23 MED ORDER — ZIPRASIDONE MESYLATE 20 MG IM SOLR
20.0000 mg | Freq: Once | INTRAMUSCULAR | Status: AC
  Administered 2018-05-23: 20 mg via INTRAMUSCULAR
  Filled 2018-05-23: qty 20

## 2018-05-23 MED ORDER — LORAZEPAM 1 MG PO TABS
2.0000 mg | ORAL_TABLET | Freq: Once | ORAL | Status: AC
  Administered 2018-05-23: 2 mg via ORAL
  Filled 2018-05-23: qty 2

## 2018-05-23 MED ORDER — OLANZAPINE 5 MG PO TBDP
5.0000 mg | ORAL_TABLET | Freq: Once | ORAL | Status: AC
  Administered 2018-05-23: 5 mg via ORAL
  Filled 2018-05-23: qty 1

## 2018-05-23 MED ORDER — QUETIAPINE FUMARATE ER 300 MG PO TB24
300.0000 mg | ORAL_TABLET | Freq: Every day | ORAL | Status: DC
  Administered 2018-05-23: 300 mg via ORAL
  Filled 2018-05-23: qty 1

## 2018-05-23 MED ORDER — STERILE WATER FOR INJECTION IJ SOLN
INTRAMUSCULAR | Status: AC
  Administered 2018-05-23: 1.2 mL
  Filled 2018-05-23: qty 10

## 2018-05-23 MED ORDER — LITHIUM CARBONATE 300 MG PO CAPS
600.0000 mg | ORAL_CAPSULE | Freq: Two times a day (BID) | ORAL | Status: DC
  Administered 2018-05-24: 600 mg via ORAL
  Filled 2018-05-23: qty 2

## 2018-05-23 MED ORDER — OLANZAPINE 10 MG PO TBDP: 10.0000 mg | ORAL_TABLET | Freq: Once | ORAL | Status: DC

## 2018-05-23 MED ORDER — ASENAPINE MALEATE 5 MG SL SUBL
10.0000 mg | SUBLINGUAL_TABLET | Freq: Two times a day (BID) | SUBLINGUAL | Status: DC
  Administered 2018-05-23 – 2018-05-24 (×2): 10 mg via SUBLINGUAL
  Filled 2018-05-23 (×2): qty 2

## 2018-05-23 NOTE — ED Provider Notes (Addendum)
Patient started to become aggressive towards staff, yelling and cursing.  Attempting to physically intimidate them, acting as though he was going to strike them, and threatening violence against them. I suspect the patient's mania has reached a point to where he is a danger to himself and others and I think involuntary commitment is the best course of action at this time.   Clinical Course as of May 24 1  Mon May 23, 2018  1631 Spoke with Marylene LandAngela, Case Manager, states social work would be better able to help this patient for possibly setting up housing following   [SJ]  2257 RN in psych hold called stating patient is agitated and behaving aggressively.  Yelling and cursing at staff, putting his hands in their faces if he is going to strike them, and attempting to instigate fights with other patients.  Security and police required for patient and staff safety.   [SJ]    Clinical Course User Index [SJ] De BlanchJoy, Shawn C, PA-C     Joy, Shawn C, PA-C 05/23/18 2312    Anselm PancoastJoy, Shawn C, PA-C 05/24/18 0002    Tilden Fossaees, Elizabeth, MD 05/24/18 [phone]
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
## 2018-05-23 NOTE — Care Management Note (Signed)
Case Management Note  CM consulted for homeless issues.  Spoke with Joy, PA who advised that pt is being evaluated by TTS but appears to be very manic.  CM will defer transition of care planning to CSW for homelessness and TTS recommendations.  CM will continue to follow for other needs.  Charles Ross, Charles SandhoffAngela N, RN 05/23/2018, 4:33 PM

## 2018-05-23 NOTE — ED Notes (Signed)
Patient becoming more anxious and agitated saying he cannot sleep.  Talking in a loud voice.  Dr. Madilyn Hookees notified.  MD at bedside assessing patient.  Ativan ordered for agitation.

## 2018-05-23 NOTE — BH Assessment (Signed)
[facility] Assessment Progress Note Case was staffed with Lord DNP who recommended patient be monitored and observed for safety. Patient will be seen by psychiatry in the a.m.        

## 2018-05-23 NOTE — ED Notes (Signed)
Patient comes of his room and starts an argument with patient in room 34. Staff redirects patient back to his room but he continues to try and aggravate the situation. Patient [facility] to [facility]. Encouragement and support provided and safety maintain. Q 15 min safety checks remain in place and video monitoring.

## 2018-05-23 NOTE — ED Notes (Signed)
Harolyn RutherfordShawn Joy, PA at bedside.

## 2018-05-23 NOTE — ED Notes (Signed)
Patient continues to curse at staff and walk in patient rooms. Patient continues to start arguments with patient in room 34. Patient has been escorted to his room several times [facility] officers and continues to come out and disrupt milieu. Shawn, PA informed of patient;s behavior and reports that he will proceed with IVC paper work. Encouragement and support provided and safety maintain. Q 15 min safety checks remain in place and video monitoring.

## 2018-05-23 NOTE — ED Notes (Signed)
Report given to Cynthia,RN.

## 2018-05-23 NOTE — Progress Notes (Signed)
Consult request has been received. CSW attempting to follow up at present time.  CSW notes pt was seen by TTS and is to remain overnight to bee seen by psychiatry.  2nd shift ED CSW will leave handoff for 1st shift ED CSW.  Dorothe PeaJonathan F. Christen Wardrop, LCSW, LCAS, CSI Clinical Social Worker Ph: [phone]
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
## 2018-05-23 NOTE — ED Triage Notes (Signed)
Pt states he is exhausted and needs to sleep. Pt states he has not slept in the past 3 days. Pt states he tried checking into a hotel but his card kept getting rejected. 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 Pt denies HI/SI.

## 2018-05-24 DIAGNOSIS — K529 Noninfective gastroenteritis and colitis, unspecified: Secondary | ICD-10-CM

## 2018-05-24 DIAGNOSIS — Z87891 Personal history of nicotine dependence: Secondary | ICD-10-CM

## 2018-05-24 DIAGNOSIS — Z818 Family history of other mental and behavioral disorders: Secondary | ICD-10-CM

## 2018-05-24 DIAGNOSIS — F319 Bipolar disorder, unspecified: Secondary | ICD-10-CM

## 2018-05-24 MED ORDER — QUETIAPINE FUMARATE 100 MG PO TABS
100.0000 mg | ORAL_TABLET | Freq: Once | ORAL | Status: AC
  Administered 2018-05-24: 100 mg via ORAL
  Filled 2018-05-24: qty 1

## 2018-05-24 MED ORDER — QUETIAPINE FUMARATE ER 300 MG PO TB24: 300.0000 mg | ORAL_TABLET | Freq: Every day | ORAL | 0 refills | Status: DC

## 2018-05-24 MED ORDER — LITHIUM CARBONATE 600 MG PO CAPS: 600.0000 mg | ORAL_CAPSULE | Freq: Two times a day (BID) | ORAL | 0 refills | Status: DC

## 2018-05-24 MED ORDER — BUPROPION HCL ER (XL) 150 MG PO TB24: 150.0000 mg | ORAL_TABLET | Freq: Every day | ORAL | 0 refills | Status: DC

## 2018-05-24 NOTE — ED Notes (Signed)
Patient came out of his room with all of his clothes off. Patient has to be redirected by staff to put his clothes back on.

## 2018-05-24 NOTE — Consult Note (Addendum)
[facility] Psych ED Discharge  05/24/2018 12:27 PM Charles Ross  MRN:  [id number] Principal Problem: Bipolar I disorder [facility]) Discharge Diagnoses:  Patient Active Problem List   Diagnosis Date Noted  . Bipolar I disorder (HCC) [F31.9] 05/24/2018  . Enteritis [K52.9]     Subjective: Patient denies any SI/HI/AVH and contracts for safety. He reports that he was diagnosed in 1993 with Bipolar. He has been homeless for the last 2 weeks or so. He reports his last hospital admission was 4 years ago. He feel she just need to be back on his medications.He reports that Seroquel has worked for him the best and wishes to continue it. He states that he has a car and a credit card that works. He does not feel he needs inpatient, just his medications.   Objective: Patient is pleasant and cooperative. He had some bizarre behavior while on the unit which required behavioral medications, but has been cooperative and appropriate this morning. Patient has been communicating with staff appropriately since I assessed him. He will follow up with outpatient services and continue his medications.  Total Time spent with patient: 30 minutes  Past Psychiatric History: Reports multiple medications, reports previous hospitalizations, Bipolar I.   Past Medical History:  Past Medical History:  Diagnosis Date  . Bipolar 1 disorder [facility])     Past Surgical History:  Procedure Laterality Date  . HIATAL HERNIA REPAIR     Family History: No family history on file. Family Psychiatric  History: Maternal Grandmother- Bipolar, Maternal Grandfather died by suicide Social History:  Social History   Substance and Sexual Activity  Alcohol Use Yes   Comment: occasional     Social History   Substance and Sexual Activity  Drug Use No    Social History   Socioeconomic History  . Marital status: Single    Spouse name: Not on file  . Number of children: Not on file  . Years of education: Not on file  . Highest education  level: Not on file  Occupational History  . Not on file  Social Needs  . Financial resource strain: Not on file  . Food insecurity:    Worry: Not on file    Inability: Not on file  . Transportation needs:    Medical: Not on file    Non-medical: Not on file  Tobacco Use  . Smoking status: Former Games developer  . Smokeless tobacco: Never Used  Substance and Sexual Activity  . Alcohol use: Yes    Comment: occasional  . Drug use: No  . Sexual activity: Not on file  Lifestyle  . Physical activity:    Days per week: Not on file    Minutes per session: Not on file  . Stress: Not on file  Relationships  . Social connections:    Talks on phone: Not on file    Gets together: Not on file    Attends religious service: Not on file    Active member of club or organization: Not on file    Attends meetings of clubs or organizations: Not on file    Relationship status: Not on file  Other Topics Concern  . Not on file  Social History Narrative  . Not on file    Has this patient used any form of tobacco in the last 30 days? (Cigarettes, Smokeless Tobacco, Cigars, and/or Pipes): No  Current Medications: Current Facility-Administered Medications  Medication Dose Route Frequency Provider Last Rate Last Dose  . asenapine (SAPHRIS) sublingual  tablet 10 mg  10 mg Sublingual BID Charm RingsLord, Jamison Y, NP   10 mg at 05/24/18 0825  . lithium carbonate capsule 600 mg  600 mg Oral BID WC Charm RingsLord, Jamison Y, NP   600 mg at 05/24/18 0824  . QUEtiapine (SEROQUEL XR) 24 hr tablet 300 mg  300 mg Oral QHS Charm RingsLord, Jamison Y, NP   300 mg at 05/23/18 2133   Current Outpatient Medications  Medication Sig Dispense Refill  . albuterol (PROVENTIL HFA;VENTOLIN HFA) 108 (90 Base) MCG/ACT inhaler Inhale 2 puffs into the lungs every 6 (six) hours as needed for wheezing or shortness of breath. 1 Inhaler 0  . buPROPion (WELLBUTRIN XL) 150 MG 24 hr tablet Take 150 mg by mouth daily.    Marland Kitchen. lithium 600 MG capsule Take 600 mg by mouth 2  (two) times daily with a meal.    . omeprazole (PRILOSEC OTC) 20 MG tablet Take 20 mg by mouth 2 (two) times daily.    . QUEtiapine (SEROQUEL XR) 300 MG 24 hr tablet Take 300 mg by mouth at bedtime.     PTA Medications:  (Not in a hospital admission)  Musculoskeletal: Strength & Muscle Tone: within normal limits Gait & Station: normal Patient leans: N/A  Psychiatric Specialty Exam: Physical Exam  Nursing note and vitals reviewed. Constitutional: He is oriented to person, place, and time. He appears well-developed and well-nourished.  HENT:  Head: Normocephalic and atraumatic.  Neck: Normal range of motion.  Cardiovascular: Normal rate.  Respiratory: Effort normal.  Musculoskeletal: Normal range of motion.  Neurological: He is alert and oriented to person, place, and time.  Skin: Skin is warm.  Psychiatric: He has a normal mood and affect. His speech is normal and behavior is normal. Judgment and thought content normal.    Review of Systems  Constitutional: Negative.   HENT: Negative.   Eyes: Negative.   Respiratory: Negative.   Cardiovascular: Negative.   Gastrointestinal: Negative.   Genitourinary: Negative.   Musculoskeletal: Negative.   Skin: Negative.   Neurological: Negative.   Endo/Heme/Allergies: Negative.   Psychiatric/Behavioral: Positive for substance abuse. Negative for hallucinations and suicidal ideas.    Blood pressure (!) 150/90, pulse 88, temperature 98.1 F (36.7 C), temperature source Oral, resp. rate 18, SpO2 97 %.There is no height or weight on file to calculate BMI.  General Appearance: Casual  Eye Contact:  Good  Speech:  Clear and Coherent and Normal Rate  Volume:  Normal  Mood:  Anxious and Euthymic  Affect:  Congruent  Thought Process:  Goal Directed and Descriptions of Associations: Intact  Orientation:  Full (Time, Place, and Person)  Thought Content:  WDL  Suicidal Thoughts:  No  Homicidal Thoughts:  No  Memory:  Immediate;    Good Recent;   Good Remote;   Good  Judgement:  Fair  Insight:  Fair  Psychomotor Activity:  Normal  Concentration:  Concentration: Good and Attention Span: Good  Recall:  Good  Fund of Knowledge:  Good  Language:  Good  Akathisia:  No  Handed:  Right  AIMS (if indicated):   N/A  Assets:  Communication Skills Desire for Improvement Financial Resources/Insurance Physical Health Social Support Transportation  ADL's:  Intact  Cognition:  WNL  Sleep:   N/A     Demographic Factors:  Male, Caucasian, Low socioeconomic status and Unemployed  Loss Factors: NA  Historical Factors: Impulsivity  Risk Reduction Factors:   Positive social support  Continued Clinical Symptoms:  Bipolar Disorder:  Mixed State  Cognitive Features That Contribute To Risk:  None    Suicide Risk:  Minimal: No identifiable suicidal ideation.  Patients presenting with no risk factors but with morbid ruminations; may be classified as minimal risk based on the severity of the depressive symptoms    Plan Of Care/Follow-up recommendations:  Other:  follow up at outpatient provider.   Continue prescribed medications   Disposition: Discharge to [facility] Money, FNP 05/24/2018, 12:27 PM   Patient seen face-to-face for psychiatric evaluation, chart reviewed and case discussed with the physician extender and developed treatment plan. Reviewed the information documented and agree with the treatment plan.  Juanetta Beets, DO 05/24/18 5:04 PM

## 2018-05-24 NOTE — ED Notes (Signed)
Pt behavior bizarre, refering to his room as "castle" Disorganized on approach. Encouragement and support provided. Special checks q 15 mins in place for safety, Video monitoring in place. Will continue to monitor.

## 2018-05-24 NOTE — BH Assessment (Signed)
[facility]  Per Juanetta BeetsJacqueline Norman, DO, this pt does not require psychiatric hospitalization at this time.  Pt presents under IVC initiated by EDP Tilden FossaElizabeth Rees, MD, which Dr Sharma CovertNorman has rescinded.  [facility]  Pt is also to be provided with information regarding supportive services for the homeless.  This has been included in pt's discharge instructions.  Pt's nurse, Morrie Sheldonshley, has been notified.  Doylene Canninghomas Broedy Osbourne, MA Triage Specialist [phone]

## 2018-05-24 NOTE — ED Notes (Signed)
Psychiatry team at bedside

## 2018-05-24 NOTE — ED Notes (Signed)
Social work at bedside.  

## 2018-05-24 NOTE — Discharge Instructions (Addendum)
For your mental health needs, you are advised to follow up with [facility].  New and returning patients are seen at their walk-in clinic.  Walk-in hours are Monday - Friday from 8:00 am - 3:00 pm.  Walk-in patients are seen on a first come, first served basis.  Try to arrive as early as possible for he best chance of being seen the same day: ° °     [facility] °     [address] °     [place], [place] [address] °     [phone] ° °For your shelter needs, contact the following service providers: ° °     [facility] (operated by [facility]) °     [address] °     [place], [place] [address] °     [phone] ° °     [facility] °     [address] °     [place], [place] [address] °     [phone] ° °For day shelter and other supportive services for the homeless, contact the [facility] ([facility]): ° °     [facility] °     [address] °     [place], [place] [address] °     [phone] ° °For transitional housing, contact one of the following agencies.  They provide longer term housing than a shelter, but there is an application process: ° °     [facility] °     [facility] °     [address] °     , [place] [address] °     [phone] °

## 2018-05-24 NOTE — ED Notes (Signed)
Pt talking on hallway phone.  

## 2018-05-24 NOTE — ED Notes (Signed)
Pt d/c home per MD order. Discharge summary reviewed with pt, pt verbalizes understanding. RX provided. Pt denies SI/HI/AVH. Personal property returned. Pt signed e-signature. Ambulatory off unit with MHT.

## 2018-05-24 NOTE — ED Notes (Signed)
Patient came out of his room with nothing on but boxers. Staff redirected him back to his room and informed him that he needed to but his clothes  back on before coming in hallway.

## 2018-05-24 NOTE — Progress Notes (Signed)
CSW met with patient at bedside. Patient requested a list of homeless shelters in the area. CSW sat down with patient and went over homeless shelter resource list, [facility] information and food resource list. Per patient, he was living with his parents before but was kicked out and is now homeless. Patient states he doesn't have concerns for food, just wants a place to live. CSW left resource list with patient to review on his own. No further CSW needs at this time. Please reconsult if needs arise.  Ollen Barges, [place] Work Department  [employer]  [phone]

## 2018-05-24 NOTE — ED Notes (Signed)
Patient came out of room again with all his clothes off except he has a yellow sock over his private area. Patient has to be redirected back to his room again to put his clothes on.

## 2018-05-26 ENCOUNTER — Ambulatory Visit (HOSPITAL_COMMUNITY)
Admission: RE | Admit: 2018-05-26 | Discharge: 2018-05-26 | Disposition: A | Discharge status: Home / Self Care | Payer: BLUE CROSS/BLUE SHIELD | Attending: Psychiatry | Admitting: Psychiatry

## 2018-05-26 NOTE — BH Assessment (Signed)
Patient presented for walk-in assessment accompanied by parents. Per father Sundra Aland(Bernie Fitzhenry) patient waited approx. 2 hours then walked outside, "[facility] Danny with security and I walked around building (inside and outside) attempting to locate patient for assessment. Patient only indicated "wanting to be alone" as symptom on walk-in form. Per parents patient denies SI and HI. Parents attempted to call and text patient, no answer.  Parents declined call to police. Offered support and encouragement to parents.

## 2018-05-27 ENCOUNTER — Encounter (HOSPITAL_COMMUNITY): Payer: Self-pay | Admitting: Emergency Medicine

## 2018-05-27 ENCOUNTER — Emergency Department (HOSPITAL_COMMUNITY)
Admission: EM | Admit: 2018-05-27 | Discharge: 2018-05-27 | Disposition: A | Discharge status: Home / Self Care | Payer: BLUE CROSS/BLUE SHIELD | Attending: Emergency Medicine | Admitting: Emergency Medicine

## 2018-05-27 DIAGNOSIS — Z5321 Procedure and treatment not carried out due to patient leaving prior to being seen by health care provider: Secondary | ICD-10-CM | POA: Diagnosis not present

## 2018-05-27 DIAGNOSIS — Z760 Encounter for issue of repeat prescription: Secondary | ICD-10-CM | POA: Diagnosis present

## 2018-05-27 NOTE — ED Notes (Signed)
Pt states that he forgot that he has an appt. And appreciates our help but he has to go. Pt is getting dressed.

## 2018-05-27 NOTE — ED Notes (Signed)
Pt approaches nurses station and states he must leave for an appointment and "he's already being admitted to [facility] anyway". Pt states he "just looked at the clock and realized what time it is, and he must go". States he is thankful for our help, but needs to leave at this time. Denies SI/HI.

## 2018-05-27 NOTE — ED Notes (Signed)
Patient walked back to the Triage doors and tried to enter; redirected by security and advised that he could not enter through there. Patient then walked out, muttering and cursing under his breath.

## 2018-05-27 NOTE — ED Triage Notes (Addendum)
Patient arrived with EMS from street(homeless) , requesting psychiatric evaluation and medications for  his Bipolar disease , he has not taken his medications for several days with flight of ideas / agitation and restlessness. Denies suicidal ideation /no hallucinations. .Marland Kitchen

## 2018-05-27 NOTE — ED Notes (Signed)
When patient was told that he would be going back to a room, patient apologized for his earlier behavior.

## 2018-05-27 NOTE — ED Notes (Signed)
Patient came to [facility] to state that he is upset that other people are getting a room before he does. Patient has been pacing in the lobby. Asked what time he would get a room and was advised that I could not give him a specific time but that we do have multiple discharges coming up. Patient then stated that he "checked-in in the back, I didn't check in with you!" (patient came EMS). I told patient that I understand but he was the last one to check in and there are two people ahead of him. Patient then states "that's unacceptable, I want to speak to the head huancho".  Ivory BroadKelly M., RN advised and came out to speak to patient. Patient marched out of the lobby, with his clothes basket in hand, muttering and complaining. Then he came back in and said "I am still waiting".

## 2019-04-11 ENCOUNTER — Emergency Department (HOSPITAL_COMMUNITY)
Admission: EM | Admit: 2019-04-11 | Discharge: 2019-04-11 | Disposition: A | Discharge status: Home / Self Care | Payer: No Typology Code available for payment source | Attending: Emergency Medicine | Admitting: Emergency Medicine

## 2019-04-11 ENCOUNTER — Encounter (HOSPITAL_COMMUNITY): Payer: Self-pay | Admitting: *Deleted

## 2019-04-11 ENCOUNTER — Other Ambulatory Visit: Payer: Self-pay

## 2019-04-11 DIAGNOSIS — S39012A Strain of muscle, fascia and tendon of lower back, initial encounter: Secondary | ICD-10-CM | POA: Insufficient documentation

## 2019-04-11 DIAGNOSIS — Y9389 Activity, other specified: Secondary | ICD-10-CM | POA: Diagnosis not present

## 2019-04-11 DIAGNOSIS — Z79899 Other long term (current) drug therapy: Secondary | ICD-10-CM | POA: Diagnosis not present

## 2019-04-11 DIAGNOSIS — X500XXA Overexertion from strenuous movement or load, initial encounter: Secondary | ICD-10-CM | POA: Diagnosis not present

## 2019-04-11 DIAGNOSIS — Y929 Unspecified place or not applicable: Secondary | ICD-10-CM | POA: Insufficient documentation

## 2019-04-11 DIAGNOSIS — Z87891 Personal history of nicotine dependence: Secondary | ICD-10-CM | POA: Diagnosis not present

## 2019-04-11 DIAGNOSIS — S3992XA Unspecified injury of lower back, initial encounter: Secondary | ICD-10-CM | POA: Diagnosis present

## 2019-04-11 DIAGNOSIS — Y990 Civilian activity done for income or pay: Secondary | ICD-10-CM | POA: Diagnosis not present

## 2019-04-11 MED ORDER — NAPROXEN 500 MG PO TABS: 500.0000 mg | ORAL_TABLET | Freq: Two times a day (BID) | ORAL | 0 refills | Status: DC | PRN

## 2019-04-11 MED ORDER — METHOCARBAMOL 500 MG PO TABS: 500.0000 mg | ORAL_TABLET | Freq: Two times a day (BID) | ORAL | 0 refills | Status: AC | PRN

## 2019-04-11 NOTE — ED Provider Notes (Signed)
[facility]   CSN: [id number] Arrival date & time: 04/11/19  0236     History   Chief Complaint Chief Complaint  Patient presents with  . Back Pain    HPI Weston SettleMichael S Gunning is a 48 y.o. male.     The history is provided by the patient. No language interpreter was used.  Back Pain Location:  Lumbar spine Radiates to:  Does not radiate Pain severity:  Mild Onset quality:  Sudden Duration:  4 hours Timing:  Constant Progression:  Unchanged Chronicity:  New Context: lifting heavy objects (lifting 40lb bag of kitty litter)   Relieved by:  Ibuprofen Worsened by:  Movement Associated symptoms: no bladder incontinence, no bowel incontinence, no fever, no numbness, no paresthesias, no perianal numbness and no weakness   Risk factors: no hx of cancer and not obese     Past Medical History:  Diagnosis Date  . Bipolar 1 disorder [facility](HCC)     Patient Active Problem List   Diagnosis Date Noted  . Bipolar I disorder (HCC) 05/24/2018  . Enteritis     Past Surgical History:  Procedure Laterality Date  . HIATAL HERNIA REPAIR          Home Medications    Prior to Admission medications   Medication Sig Start Date End Date Taking? Authorizing Provider  albuterol (PROVENTIL HFA;VENTOLIN HFA) 108 (90 Base) MCG/ACT inhaler Inhale 2 puffs into the lungs every 6 (six) hours as needed for wheezing or shortness of breath. 11/03/17   Ofilia Neaslark, Enrigue L, PA-C  buPROPion (WELLBUTRIN XL) 150 MG 24 hr tablet Take 1 tablet (150 mg total) by mouth daily. Mood control 05/24/18   Money, Gerlene Burdockravis B, FNP  lithium 600 MG capsule Take 1 capsule (600 mg total) by mouth 2 (two) times daily with a meal. Mood control 05/24/18   Money, Gerlene Burdockravis B, FNP  methocarbamol (ROBAXIN) 500 MG tablet Take 1 tablet (500 mg total) by mouth every 12 (twelve) hours as needed for muscle spasms. 04/11/19   Antony MaduraHumes, Zelda Reames, PA-C  naproxen (NAPROSYN) 500 MG tablet Take 1 tablet (500  mg total) by mouth every 12 (twelve) hours as needed for mild pain or moderate pain. 04/11/19   Antony MaduraHumes, Danni Shima, PA-C  omeprazole (PRILOSEC OTC) 20 MG tablet Take 20 mg by mouth 2 (two) times daily.    [provider]  QUEtiapine (SEROQUEL XR) 300 MG 24 hr tablet Take 1 tablet (300 mg total) by mouth at bedtime. Mood control 05/24/18   Money, Gerlene Burdockravis B, FNP    Family History History reviewed. No pertinent family history.  Social History Social History   Tobacco Use  . Smoking status: Former Games developermoker  . Smokeless tobacco: Never Used  Substance Use Topics  . Alcohol use: Yes    Comment: occasional  . Drug use: No     Allergies   Aspirin   Review of Systems Review of Systems  Constitutional: Negative for fever.  Gastrointestinal: Negative for bowel incontinence.  Genitourinary: Negative for bladder incontinence.  Musculoskeletal: Positive for back pain.  Neurological: Negative for weakness, numbness and paresthesias.  Ten systems reviewed and are negative for acute change, except as noted in the HPI.    Physical Exam Updated Vital Signs BP 127/78 (BP Location: Right Arm)   Pulse 98   Temp 98.9 F (37.2 C) (Oral)   Resp 18   SpO2 98%   Physical Exam Vitals signs and nursing note reviewed.  Constitutional:  General: He is not in acute distress.    Appearance: He is well-developed. He is not diaphoretic.     Comments: Nontoxic appearing and in NAD  HENT:     Head: Normocephalic and atraumatic.  Eyes:     General: No scleral icterus.    Conjunctiva/sclera: Conjunctivae normal.  Neck:     Musculoskeletal: Normal range of motion.  Cardiovascular:     Rate and Rhythm: Normal rate and regular rhythm.     Pulses: Normal pulses.  Pulmonary:     Effort: Pulmonary effort is normal. No respiratory distress.     Breath sounds: No stridor. No wheezing.     Comments: Respirations even and unlabored Musculoskeletal: Normal range of motion.        General: Tenderness  present.     Lumbar back: He exhibits tenderness and pain (mild). He exhibits no swelling and no deformity.       Back:     Comments: Mild tenderness to the right of the lumbar midline.  No bony deformities, step-offs, crepitus to the lumbosacral midline.  No significant muscle spasm appreciated.  Skin:    General: Skin is warm and dry.     Coloration: Skin is not pale.     Findings: No erythema or rash.  Neurological:     Mental Status: He is alert and oriented to person, place, and time.     Coordination: Coordination normal.  Psychiatric:        Behavior: Behavior normal.      ED Treatments / Results  Labs (all labs ordered are listed, but only abnormal results are displayed) Labs Reviewed - No data to display  EKG None  Radiology No results found.  Procedures Procedures (including critical care time)  Medications Ordered in ED Medications - No data to display   Initial Impression / Assessment and Plan / ED Course  I have reviewed the triage vital signs and the nursing notes.  Pertinent labs & imaging results that were available during my care of the patient were reviewed by me and considered in my medical decision making (see chart for details).        Patient with back pain, onset after lifting a 40 pound bag of kitty litter.  Pain has improved with ibuprofen.  Patient neurovascularly intact on exam.  Ambulatory without difficulty.  No loss of bowel or bladder control.  No concern for cauda equina.  No fever, night sweats, weight loss, h/o cancer, IVDU.  RICE protocol and pain medicine indicated and discussed with patient.  Will give occupational lifting restrictions x 1 week.  Return precautions discussed and provided. Patient discharged in stable condition with no unaddressed concerns.   Final Clinical Impressions(s) / ED Diagnoses   Final diagnoses:  Strain of lumbar region, initial encounter    ED Discharge Orders         Ordered    naproxen (NAPROSYN)  500 MG tablet  Every 12 hours PRN     04/11/19 0447    methocarbamol (ROBAXIN) 500 MG tablet  Every 12 hours PRN     04/11/19 0447           Antonietta Breach, PA-C 04/11/19 0512    Fatima Blank, MD 04/11/19 [phone]

## 2019-04-11 NOTE — ED Triage Notes (Signed)
Pt reports heavy lifting today and now has lower back pain. Ambulatory on arrival.

## 2019-04-11 NOTE — Discharge Instructions (Signed)
Alternate ice and heat to areas of injury 3-4 times per day to limit inflammation and spasm.  Avoid strenuous activity and heavy lifting.  We recommend consistent use of naproxen in addition to Robaxin for muscle spasms. Do not drive or drink alcohol after taking Robaxin as it may make you drowsy and impair your judgment.  We recommend follow-up with a primary care doctor to ensure resolution of symptoms.  Return to the ED for any new or concerning symptoms. 

## 2019-08-23 ENCOUNTER — Emergency Department (HOSPITAL_COMMUNITY): Payer: Self-pay

## 2019-08-23 ENCOUNTER — Emergency Department (HOSPITAL_COMMUNITY)
Admission: EM | Admit: 2019-08-23 | Discharge: 2019-08-23 | Disposition: A | Discharge status: Home / Self Care | Payer: Self-pay | Attending: Emergency Medicine | Admitting: Emergency Medicine

## 2019-08-23 ENCOUNTER — Other Ambulatory Visit: Payer: Self-pay

## 2019-08-23 DIAGNOSIS — M79671 Pain in right foot: Secondary | ICD-10-CM | POA: Insufficient documentation

## 2019-08-23 DIAGNOSIS — Z79899 Other long term (current) drug therapy: Secondary | ICD-10-CM | POA: Insufficient documentation

## 2019-08-23 DIAGNOSIS — Z87891 Personal history of nicotine dependence: Secondary | ICD-10-CM | POA: Insufficient documentation

## 2019-08-23 NOTE — Discharge Instructions (Addendum)
Can wear ace wrap for comfort.  Tylenol (up to 2000mg  daily) or motrin (up to 800mg  3x daily) for pain. Follow-up with the wellness clinic-- can call in the morning for appt. Return here for any new/acute changes.

## 2019-08-23 NOTE — ED Notes (Signed)
Patient verbalized understanding of dc instructions, vss, ambulatory with nad.   

## 2019-08-23 NOTE — ED Provider Notes (Signed)
[place] EMERGENCY DEPARTMENT Provider Note   CSN: [id number] Arrival date & time: 08/23/19  1443     History   Chief Complaint Chief Complaint  Patient presents with  . Foot Pain    right    HPI Charles Ross is a 48 y.o. male.     The history is provided by the patient and medical records.  Foot Pain    48 year old male with history of bipolar disorder, presenting to the ED with right foot pain.  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  States it felt like something popped out of place.  States since then whenever bearing weight on his right foot he has pain.  He denies any numbness or tingling.  He remains ambulatory.  No prior right foot injuries or surgeries.  No intervention prior to arrival.    Past Medical History:  Diagnosis Date  . Bipolar 1 disorder [facility])     Patient Active Problem List   Diagnosis Date Noted  . Bipolar I disorder ([place]) 05/24/2018  . Enteritis     Past Surgical History:  Procedure Laterality Date  . HIATAL HERNIA REPAIR          Home Medications    Prior to Admission medications   Medication Sig Start Date End Date Taking? Authorizing Provider  albuterol (PROVENTIL HFA;VENTOLIN HFA) 108 (90 Base) MCG/ACT inhaler Inhale 2 puffs into the lungs every 6 (six) hours as needed for wheezing or shortness of breath. 11/03/17   Tereasa Coop, PA-C  buPROPion (WELLBUTRIN XL) 150 MG 24 hr tablet Take 1 tablet (150 mg total) by mouth daily. Mood control 05/24/18   Money, Lowry Ram, FNP  lithium 600 MG capsule Take 1 capsule (600 mg total) by mouth 2 (two) times daily with a meal. Mood control 05/24/18   Money, Lowry Ram, FNP  methocarbamol (ROBAXIN) 500 MG tablet Take 1 tablet (500 mg total) by mouth every 12 (twelve) hours as needed for muscle spasms. 04/11/19   Antonietta Breach, PA-C  naproxen (NAPROSYN) 500 MG tablet Take 1 tablet (500 mg total) by  mouth every 12 (twelve) hours as needed for mild pain or moderate pain. 04/11/19   Antonietta Breach, PA-C  omeprazole (PRILOSEC OTC) 20 MG tablet Take 20 mg by mouth 2 (two) times daily.    [provider]  QUEtiapine (SEROQUEL XR) 300 MG 24 hr tablet Take 1 tablet (300 mg total) by mouth at bedtime. Mood control 05/24/18   Money, Lowry Ram, FNP    Family History No family history on file.  Social History Social History   Tobacco Use  . Smoking status: Former Research scientist (life sciences)  . Smokeless tobacco: Never Used  Substance Use Topics  . Alcohol use: Yes    Comment: occasional  . Drug use: No     Allergies   Aspirin   Review of Systems Review of Systems  Musculoskeletal: Positive for arthralgias.  All other systems reviewed and are negative.    Physical Exam Updated Vital Signs BP 130/78 (BP Location: Right Arm)   Pulse 80   Temp 98 F (36.7 C) (Oral)   Resp 16   Ht 6\' 1"  (1.854 m)   Wt 83.9 kg   SpO2 100%   BMI 24.41 kg/m   Physical Exam Vitals signs and nursing note reviewed.  Constitutional:      Appearance: He  is well-developed.  HENT:     Head: Normocephalic and atraumatic.  Eyes:     Conjunctiva/sclera: Conjunctivae normal.     Pupils: Pupils are equal, round, and reactive to light.  Neck:     Musculoskeletal: Normal range of motion.  Cardiovascular:     Rate and Rhythm: Normal rate and regular rhythm.     Heart sounds: Normal heart sounds.  Pulmonary:     Effort: Pulmonary effort is normal.     Breath sounds: Normal breath sounds.  Abdominal:     General: Bowel sounds are normal.     Palpations: Abdomen is soft.  Musculoskeletal: Normal range of motion.     Comments: Right foot generally normal in appearance, toenails are discolored and overgrown but no acute bony deformities or swelling noted, able to stand, bear weight and ambulate without issue, normal distal sensation and perfusion  Skin:    General: Skin is warm and dry.  Neurological:     Mental  Status: He is alert and oriented to person, place, and time.      ED Treatments / Results  Labs (all labs ordered are listed, but only abnormal results are displayed) Labs Reviewed - No data to display  EKG None  Radiology Dg Foot Complete Right  Result Date: 08/23/2019 CLINICAL DATA:  Foot pain EXAM: RIGHT FOOT COMPLETE - 3+ VIEW COMPARISON:  None. FINDINGS: There is no evidence of fracture or dislocation. There is no evidence of arthropathy or other focal bone abnormality. Soft tissues are unremarkable. Mild calcaneal spur is noted. IMPRESSION: No acute abnormality noted. Electronically Signed   By: Alcide Clever M.D.   On: 08/23/2019 02:59    Procedures Procedures (including critical care time)  Medications Ordered in ED Medications - No data to display   Initial Impression / Assessment and Plan / ED Course  I have reviewed the triage vital signs and the nursing notes.  Pertinent labs & imaging results that were available during my care of the patient were reviewed by me and considered in my medical decision making (see chart for details).  48 year old male presenting to the ED with right foot pain.  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  He does not have any acute bony deformity or swelling noted on exam.  No discoloration.  Foot is neurovascular intact.  X-rays negative.  Ace wrap applied.  Does not currently have PCP or health insurance, will refer to wellness clinic for follow-up.  Encouraged tylenol or motrin for pain control.  He may return here for any new or acute changes.  Final Clinical Impressions(s) / ED Diagnoses   Final diagnoses:  Foot pain, right    ED Discharge Orders    None       Garlon Hatchet, PA-C 08/23/19 0324    Zadie Rhine, MD 08/23/19 [phone]

## 2019-08-23 NOTE — ED Triage Notes (Signed)
Pt works for [employer] and he went to bend down and put pressure on his feet and felt something pop in his right foot. No obvious swelling.

## 2020-01-01 IMAGING — CT CT ABD-PELV W/ CM
2 of 5 series · 16 of 46 positions shown, 18 images · IV contrast (APPLIED)
Comparison: None

CLINICAL DATA: Diarrhea and abdominal pain for 10 days.

EXAM:
CT ABDOMEN AND PELVIS WITH CONTRAST
TECHNIQUE: Multidetector CT imaging of the abdomen and pelvis was performed
using the standard protocol following bolus administration of
intravenous contrast.
CONTRAST:  100mL OMNIPAQUE IOHEXOL 300 MG/ML  SOLN

[Series 2: routine abd/pel with · axial · 0.82mm/px · z∈[-535,-70]mm · 13 of 105 slices shown, 15 images]
[im 6/105  soft-tissue]
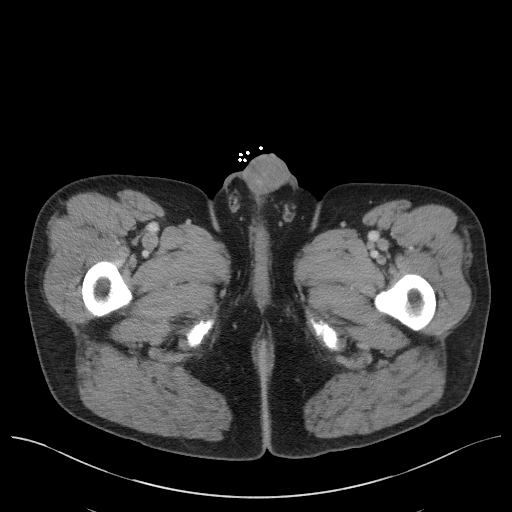
[im 6/105  bone]
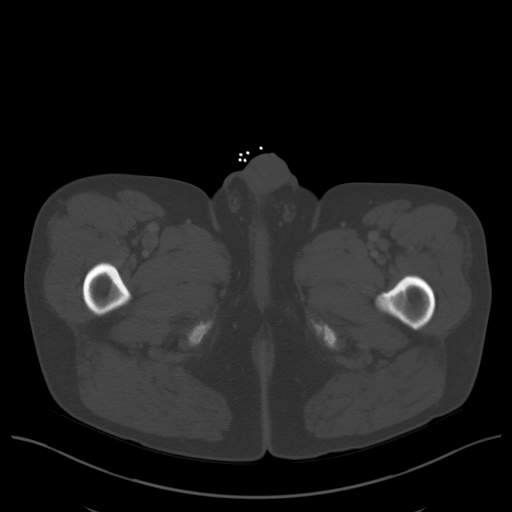
[im 12/105  soft-tissue]
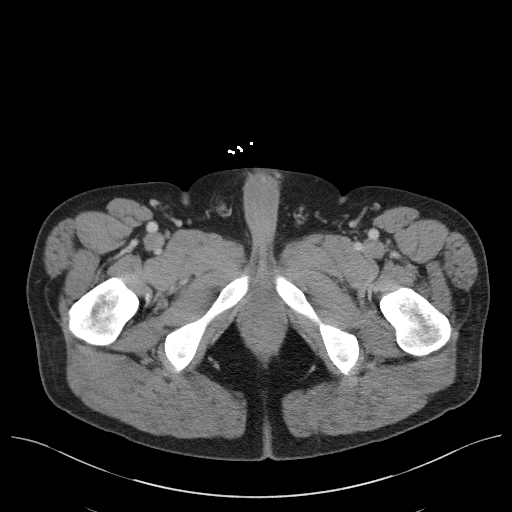
[im 24/105  soft-tissue]
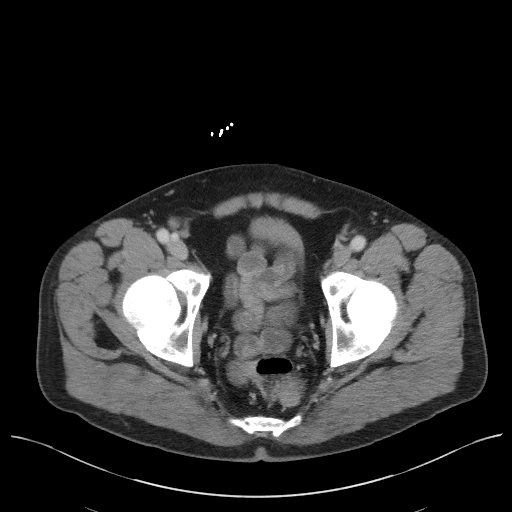
[im 29/105  soft-tissue]
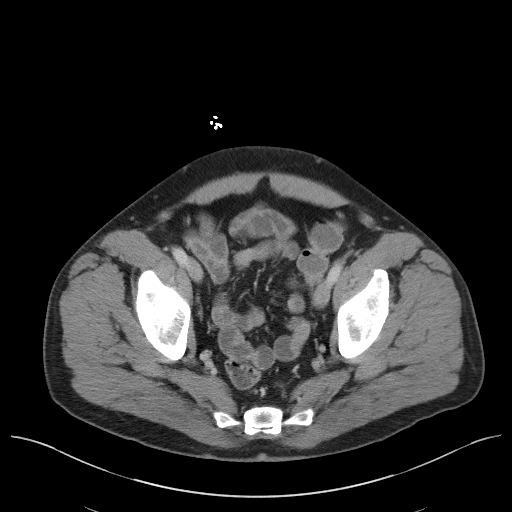
[im 35/105  soft-tissue]
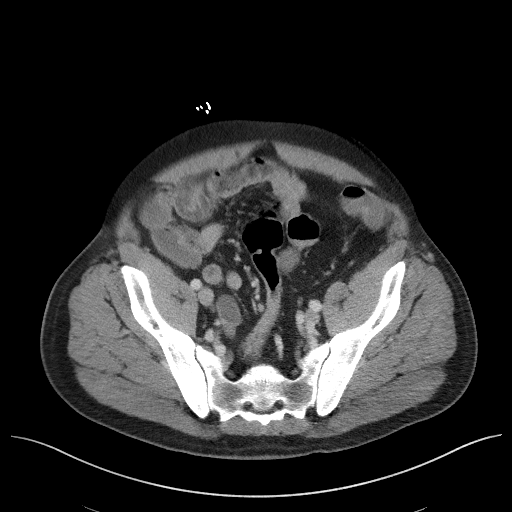
[im 47/105  soft-tissue]
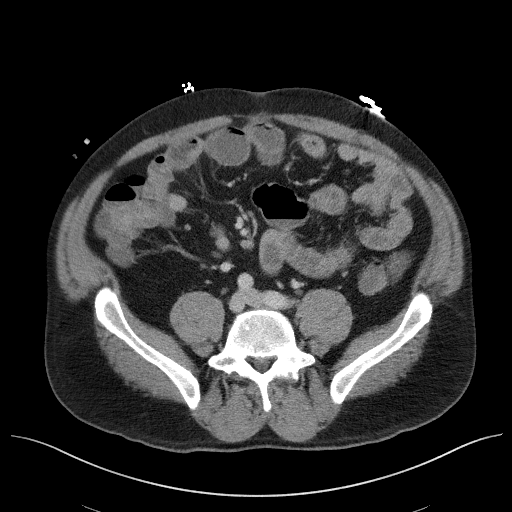
[im 53/105  soft-tissue]
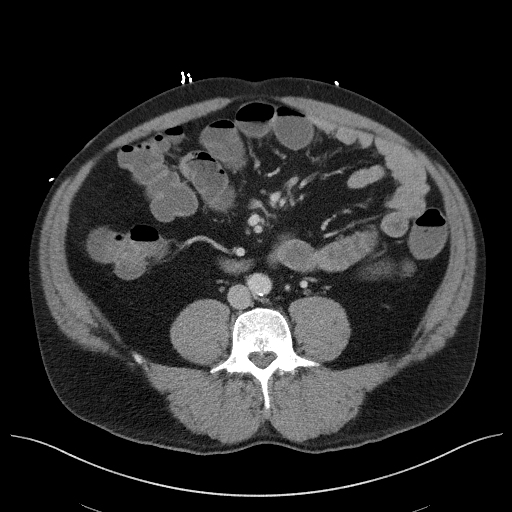
[im 58/105  soft-tissue]
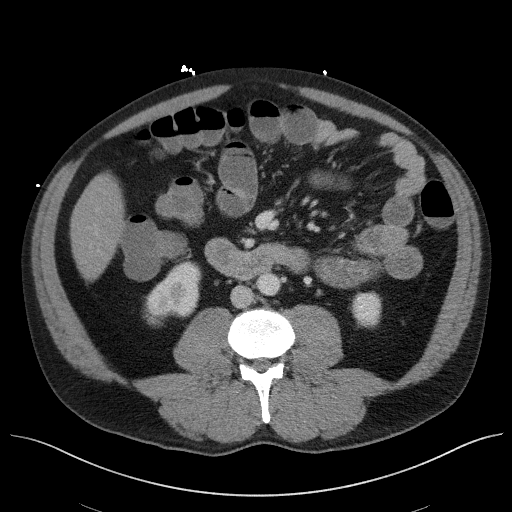
[im 70/105  soft-tissue]
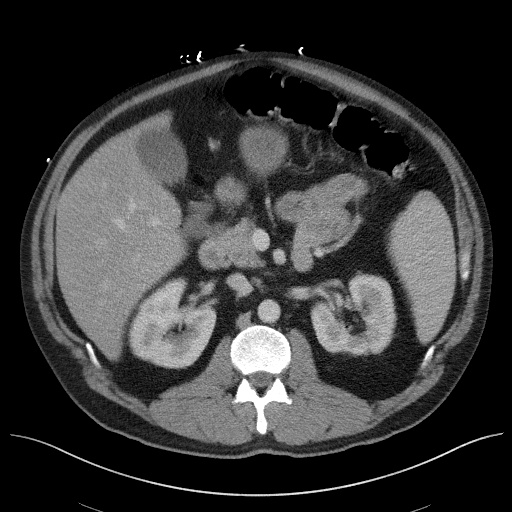
[im 70/105  bone]
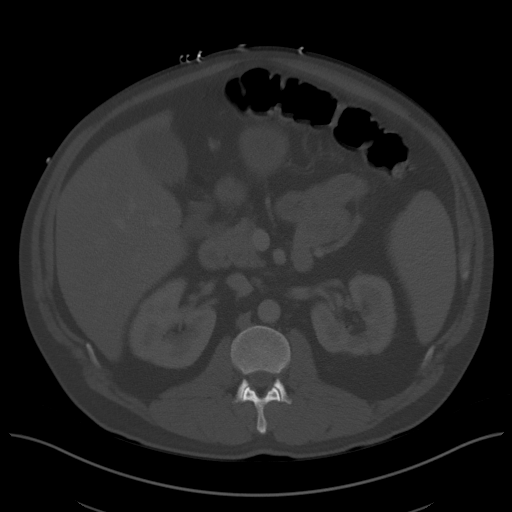
[im 76/105  soft-tissue]
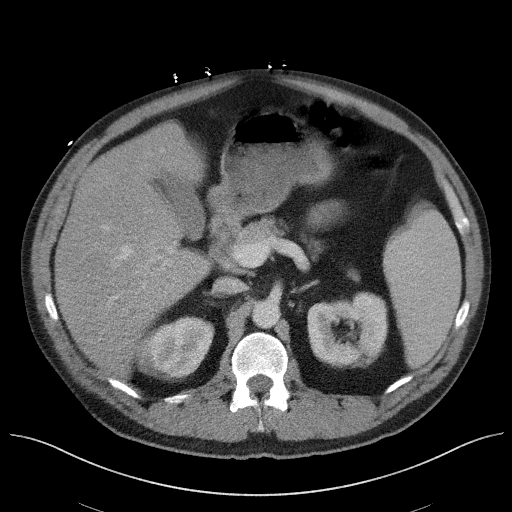
[im 81/105  soft-tissue]
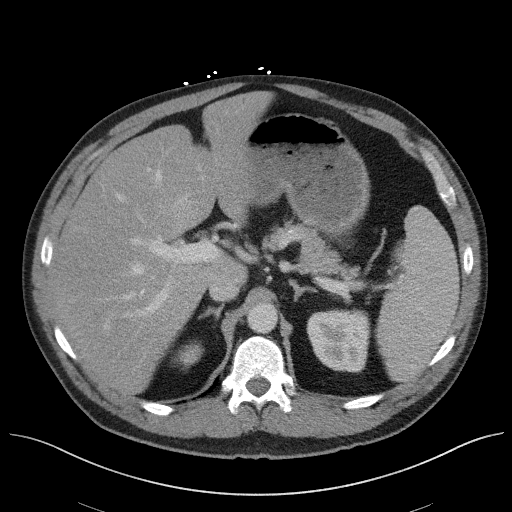
[im 93/105  soft-tissue]
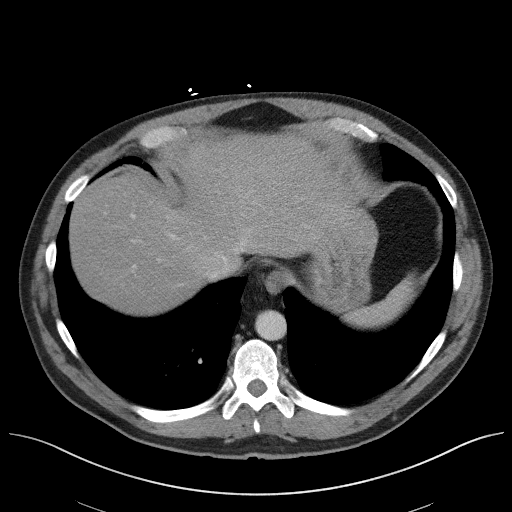
[im 99/105  soft-tissue]
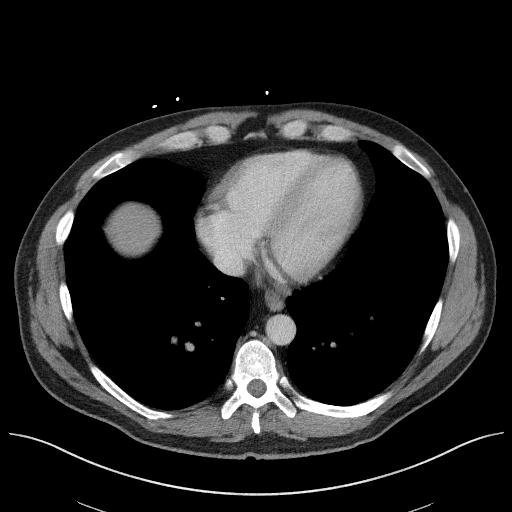

[Series 5: coronal st · coronal · 0.81mm/px · 3 of 104 slices shown]
[im 35/104  soft-tissue]
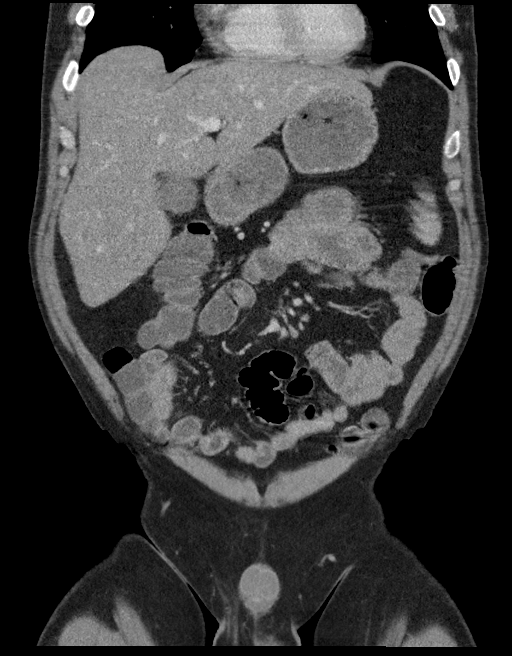
[im 46/104  soft-tissue]
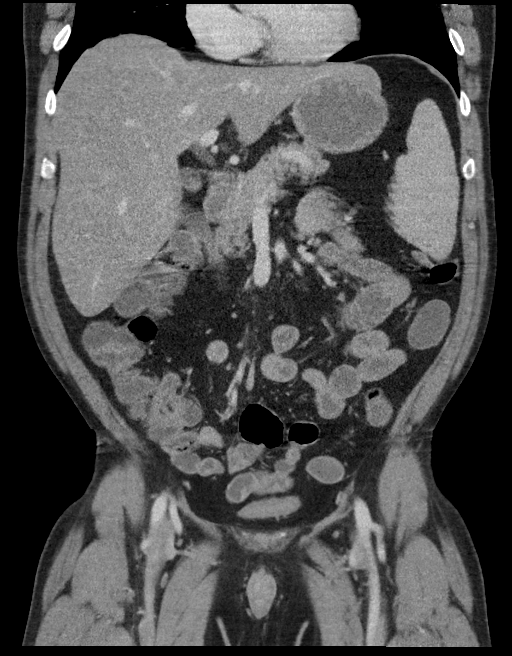
[im 58/104  soft-tissue]
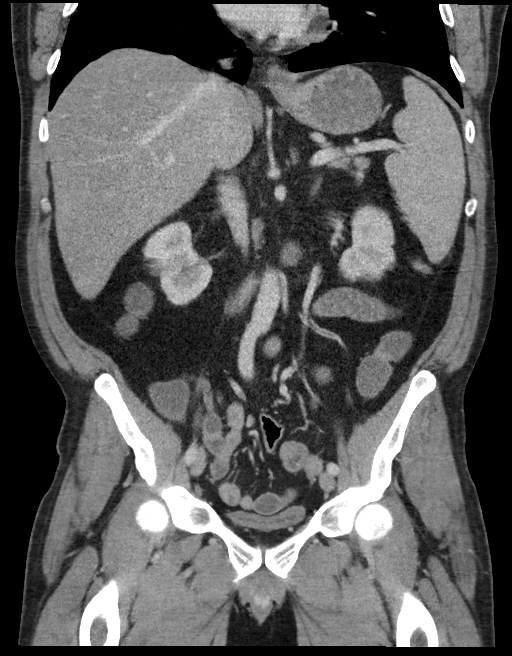

[16 of 46 positions shown; findings below may reference images not displayed]

FINDINGS: Lower chest: No acute abnormality.

Hepatobiliary: No focal liver abnormality is seen. No gallstones,
gallbladder wall thickening, or biliary dilatation.

Pancreas: Unremarkable.

Spleen: The spleen measures 13.3 by 5.9 x 13.4 cm (volume = 550
cm^3)

Adrenals/Urinary Tract: Normal appearance of the adrenal glands.
Bilateral areas of renal cortical scarring are identified, left
greater than right. No mass or hydronephrosis noted. The urinary
bladder appears normal.

Stomach/Bowel: The stomach is normal. The mid small bowel loops are
mildly dilated measuring up to 3 cm. Associated small bowel fluid
levels are identified. Transition to normal caliber distal small
bowel loops noted in the right lower quadrant of the abdomen, image
43/5. The appendix is visualized and is normal. No pathologic
dilatation of the colon.

Vascular/Lymphatic: Aortic atherosclerosis. No aneurysm. No
abdominal or pelvic adenopathy.

Reproductive: Prostate is unremarkable.

Other: No free fluid or fluid collections.

Musculoskeletal: No acute or significant osseous findings.
IMPRESSION: 1. Exam is positive for mildly dilated loops of mid small bowel with
air-fluid levels. Findings may represent low-grade small bowel
obstruction versus sequelae of enteritis. No evidence for bowel
perforation.
2. Splenomegaly.
3.  Aortic Atherosclerosis (K5ACZ-089.9).

## 2020-05-13 ENCOUNTER — Encounter (HOSPITAL_COMMUNITY): Payer: Self-pay | Admitting: Psychiatry

## 2020-05-13 ENCOUNTER — Ambulatory Visit (INDEPENDENT_AMBULATORY_CARE_PROVIDER_SITE_OTHER): Payer: BLUE CROSS/BLUE SHIELD | Admitting: Psychiatry

## 2020-05-13 ENCOUNTER — Other Ambulatory Visit: Payer: Self-pay

## 2020-05-13 DIAGNOSIS — F319 Bipolar disorder, unspecified: Secondary | ICD-10-CM | POA: Diagnosis not present

## 2020-05-13 MED ORDER — RISPERIDONE 3 MG PO TABS: 6.0000 mg | ORAL_TABLET | Freq: Every day | ORAL | 2 refills | Status: DC

## 2020-05-13 MED ORDER — LITHIUM CARBONATE 600 MG PO CAPS: 600.0000 mg | ORAL_CAPSULE | Freq: Two times a day (BID) | ORAL | 2 refills | Status: DC

## 2020-05-13 MED ORDER — FLUOXETINE HCL 40 MG PO CAPS: 40.0000 mg | ORAL_CAPSULE | Freq: Every day | ORAL | 2 refills | Status: DC

## 2020-05-13 MED ORDER — TRAZODONE HCL 150 MG PO TABS: 150.0000 mg | ORAL_TABLET | Freq: Every evening | ORAL | 2 refills | Status: DC | PRN

## 2020-05-13 NOTE — Progress Notes (Signed)
Psychiatric Initial Adult Assessment   Patient Identification: Charles Ross MRN:  [id number] Date of Evaluation:  05/13/2020 Referral Source: Vesta Mixer Chief Complaint:  I'm depressed" Visit Diagnosis:    ICD-10-CM   1. Bipolar I disorder (HCC)  F31.9 lithium 600 MG capsule    traZODone (DESYREL) 150 MG tablet    FLUoxetine (PROZAC) 40 MG capsule    risperiDONE (RISPERDAL) 3 MG tablet    Lithium level    History of Present Illness: 49 year old male seen today for initial psychiatric evaluation.  He was referred to outpatient psychiatry by [facility] for medication management.  He has a psychiatric history of bipolar disorder.  He is currently being managed on lithium 600 mg twice daily, Prozac 20 mg daily, trazodone 150 mg nightly, Risperdal 3mg  1/2 tablet in the morning and Risperdal 4.5 mg at bedtime.  He notes that he has been taking his Risperdal at night.   On exam he was well-groomed, cooperative, and calm however he had a flat affect.  He endorses adequate sleep and appetite.  He denies SI/HI/VH.  He notes that his medications are somewhat effective however notes that he is depressed most days.  He informed that he lost 20 pounds in the last 6 months.  He notes that he has not had his lithium level checked in a while.  Provider informed patient that she would like these levels drawn.  He endorsed understanding and agreed.  Patient agreeable to increasing Prozac 20 mg to 40 mg to help improve depressed mood.  He is agreeable to continue all other medications as prescribed. No other concerns noted at this time.   Associated Signs/Symptoms: Depression Symptoms:  depressed mood, weight loss, (Hypo) Manic Symptoms:  Denies Anxiety Symptoms:  Denies Psychotic Symptoms:  Denies PTSD Symptoms: NA  Past Psychiatric History: Bipolar 1  Previous Psychotropic Medications: Yes   Substance Abuse History in the last 12 months:  Yes.    Consequences of Substance Abuse: NA  Past  Medical History:  Past Medical History:  Diagnosis Date  . Bipolar 1 disorder [facility])     Past Surgical History:  Procedure Laterality Date  . HIATAL HERNIA REPAIR      Family Psychiatric History: Denies Family History: No family history on file.  Social History:   Social History   Socioeconomic History  . Marital status: Single    Spouse name: Not on file  . Number of children: Not on file  . Years of education: Not on file  . Highest education level: Not on file  Occupational History  . Not on file  Tobacco Use  . Smoking status: Former [facility]  . Smokeless tobacco: Never Used  Substance and Sexual Activity  . Alcohol use: Yes    Comment: occasional  . Drug use: No  . Sexual activity: Not on file  Other Topics Concern  . Not on file  Social History Narrative  . Not on file   Social Determinants of Health   Financial Resource Strain:   . Difficulty of Paying Living Expenses:   Food Insecurity:   . Worried About Games developer in the Last Year:   . Programme researcher, broadcasting/film/video in the Last Year:   Transportation Needs:   . Barista (Medical):   Freight forwarder Lack of Transportation (Non-Medical):   Physical Activity:   . Days of Exercise per Week:   . Minutes of Exercise per Session:   Stress:   . Feeling of Stress :  Social Connections:   . Frequency of Communication with Friends and Family:   . Frequency of Social Gatherings with Friends and Family:   . Attends Religious Services:   . Active Member of Clubs or Organizations:   . Attends Banker Meetings:   Marland Kitchen Marital Status:     Additional Social History: Patient resides in [place].  He is currently single.  He has no children.  He denies alcohol or illicit drug use he endorses tobacco use (noting he smokes a pack per day)  Allergies:   Allergies  Allergen Reactions  . Aspirin     Metabolic Disorder Labs: No results found for: HGBA1C, MPG No results found for: PROLACTIN No results found  for: CHOL, TRIG, HDL, CHOLHDL, VLDL, LDLCALC Lab Results  Component Value Date   TSH 0.661 05/23/2018    Therapeutic Level Labs: Lab Results  Component Value Date   LITHIUM 0.17 (L) 05/23/2018   No results found for: CBMZ No results found for: VALPROATE  Current Medications: Current Outpatient Medications  Medication Sig Dispense Refill  . albuterol (PROVENTIL HFA;VENTOLIN HFA) 108 (90 Base) MCG/ACT inhaler Inhale 2 puffs into the lungs every 6 (six) hours as needed for wheezing or shortness of breath. 1 Inhaler 0  . FLUoxetine (PROZAC) 40 MG capsule Take 1 capsule (40 mg total) by mouth daily. 30 capsule 2  . lithium 600 MG capsule Take 1 capsule (600 mg total) by mouth 2 (two) times daily with a meal. Mood control 60 capsule 2  . methocarbamol (ROBAXIN) 500 MG tablet Take 1 tablet (500 mg total) by mouth every 12 (twelve) hours as needed for muscle spasms. 12 tablet 0  . naproxen (NAPROSYN) 500 MG tablet Take 1 tablet (500 mg total) by mouth every 12 (twelve) hours as needed for mild pain or moderate pain. 15 tablet 0  . omeprazole (PRILOSEC OTC) 20 MG tablet Take 20 mg by mouth 2 (two) times daily.    . risperiDONE (RISPERDAL) 3 MG tablet Take 2 tablets (6 mg total) by mouth at bedtime. 60 tablet 2  . traZODone (DESYREL) 150 MG tablet Take 1 tablet (150 mg total) by mouth at bedtime as needed for sleep. 30 tablet 2   No current facility-administered medications for this visit.    Musculoskeletal: Strength & Muscle Tone: within normal limits Gait & Station: normal Patient leans: N/A  Psychiatric Specialty Exam: Review of Systems  There were no vitals taken for this visit.There is no height or weight on file to calculate BMI.  General Appearance: Well Groomed  Eye Contact:  Good  Speech:  Clear and Coherent and Normal Rate  Volume:  Normal  Mood:  Depressed  Affect:  Flat and Restricted  Thought Process:  Coherent, Goal Directed and Linear  Orientation:  Full (Time,  Place, and Person)  Thought Content:  WDL and Logical  Suicidal Thoughts:  No  Homicidal Thoughts:  No  Memory:  Immediate;   Good Recent;   Good Remote;   Good  Judgement:  Good  Insight:  Good  Psychomotor Activity:  Normal  Concentration:  Concentration: Good and Attention Span: Good  Recall:  Good  Fund of Knowledge:Good  Language: Good  Akathisia:  No  Handed:  Right  AIMS (if indicated):  Not done  Assets:  Communication Skills Desire for Improvement Financial Resources/Insurance Housing Social Support  ADL's:  Intact  Cognition: WNL  Sleep:  Good   Screenings: PHQ2-9     Office Visit from 11/03/2017  in [facility] Care at [facility] Total Score 1      Assessment and Plan: Patient endorses depressed mood and weight loss.  He is agreeable to increasing Prozac 20 mg to 40 mg to help improve depressive symptoms.  We will continue all other medications as prescribed.  1. Bipolar I disorder (HCC)  Continue- lithium 600 MG capsule; Take 1 capsule (600 mg total) by mouth 2 (two) times daily with a meal. Mood control  Dispense: 60 capsule; Refill: 2 Continue- traZODone (DESYREL) 150 MG tablet; Take 1 tablet (150 mg total) by mouth at bedtime as needed for sleep.  Dispense: 30 tablet; Refill: 2 Continue- FLUoxetine (PROZAC) 40 MG capsule; Take 1 capsule (40 mg total) by mouth daily.  Dispense: 30 capsule; Refill: 2 Continue- risperiDONE (RISPERDAL) 3 MG tablet; Take 2 tablets (6 mg total) by mouth at bedtime.  Dispense: 60 tablet; Refill: 2 - Lithium level  Follow-up in 3 months    Shanna Cisco, NP 8/16/20214:10 PM

## 2020-08-06 ENCOUNTER — Encounter (HOSPITAL_COMMUNITY): Payer: BLUE CROSS/BLUE SHIELD | Admitting: Psychiatry

## 2020-09-04 ENCOUNTER — Other Ambulatory Visit (HOSPITAL_COMMUNITY): Payer: Self-pay | Admitting: Psychiatry

## 2020-09-04 DIAGNOSIS — F319 Bipolar disorder, unspecified: Secondary | ICD-10-CM

## 2020-09-26 ENCOUNTER — Other Ambulatory Visit: Payer: Self-pay

## 2020-09-26 ENCOUNTER — Telehealth (HOSPITAL_COMMUNITY): Payer: BLUE CROSS/BLUE SHIELD | Admitting: Psychiatry

## 2020-10-07 ENCOUNTER — Other Ambulatory Visit: Payer: Self-pay

## 2020-10-07 ENCOUNTER — Encounter (HOSPITAL_COMMUNITY): Payer: Self-pay | Admitting: Psychiatry

## 2020-10-07 ENCOUNTER — Telehealth (INDEPENDENT_AMBULATORY_CARE_PROVIDER_SITE_OTHER): Payer: BLUE CROSS/BLUE SHIELD | Admitting: Psychiatry

## 2020-10-07 DIAGNOSIS — F319 Bipolar disorder, unspecified: Secondary | ICD-10-CM

## 2020-10-07 MED ORDER — LITHIUM CARBONATE 300 MG PO CAPS: ORAL_CAPSULE | ORAL | 2 refills | Status: DC

## 2020-10-07 MED ORDER — TRAZODONE HCL 150 MG PO TABS: 150.0000 mg | ORAL_TABLET | Freq: Every evening | ORAL | 2 refills | Status: DC | PRN

## 2020-10-07 MED ORDER — RISPERIDONE 3 MG PO TABS: 6.0000 mg | ORAL_TABLET | Freq: Every day | ORAL | 2 refills | Status: DC

## 2020-10-07 MED ORDER — FLUOXETINE HCL 40 MG PO CAPS: 40.0000 mg | ORAL_CAPSULE | Freq: Every day | ORAL | 2 refills | Status: DC

## 2020-10-07 NOTE — Addendum Note (Signed)
Addended by: Jahmeek Shirk E on: 10/07/2020 04:47 PM   Modules accepted: Level of Service  

## 2020-10-07 NOTE — Progress Notes (Signed)
BH MD/PA/NP OP Progress Note Virtual Visit via Telephone Note  I connected with Charles Ross on 10/07/20 at  3:00 PM EST by telephone and verified that I am speaking with the correct person using two identifiers.  Location: Patient: home Provider: Clinic   I discussed the limitations, risks, security and privacy concerns of performing an evaluation and management service by telephone and the availability of in person appointments. I also discussed with the patient that there may be a patient responsible charge related to this service. The patient expressed understanding and agreed to proceed.   I provided 30 minutes of non-face-to-face time during this encounter.   10/07/2020 3:07 PM Charles Ross  MRN:  [id number]  Chief Complaint: "I think things are going okay".  HPI: 50 year old male seen today for follow up psychiatric evaluation.    He is currently being managed on lithium 600 mg twice daily, Prozac 40 mg daily, trazodone 150 mg nightly, Risperdal 6 mg at bedtime.  He notes that his medications are effective in managing his psychiatric conditions.   Today patient unable to login virtually so his exam was done over the phone.  During exam he is pleasant, cooperative, and engaged in conversation.  He informed provider that he has minimal anxiety and depression and notes that he feels like his medications are effective in managing his psychiatric conditions. Today provider conducted a GAD-7 and patient scored an 8.  Provider also conducted a PHQ 9 and patient scored a 12.  He endorses adequate sleep and appetite.  He denies SI/HI/VAH, paranoia, or mania.   Patient has not gotten his lithium levels drawn.  Levels will be reviewed at next visit if patient has his levels redrawn.  No medication changes made today.  He is agreeable to continue all other medications as prescribed. No other concerns noted at this time.  Visit Diagnosis:    ICD-10-CM   1. Bipolar I disorder (HCC)  F31.9  lithium carbonate 300 MG capsule    risperiDONE (RISPERDAL) 3 MG tablet    traZODone (DESYREL) 150 MG tablet    FLUoxetine (PROZAC) 40 MG capsule    Past Psychiatric History: bipolar disorder  Past Medical History:  Past Medical History:  Diagnosis Date  . Bipolar 1 disorder [facility])     Past Surgical History:  Procedure Laterality Date  . HIATAL HERNIA REPAIR      Family Psychiatric History: Denies  Family History: History reviewed. No pertinent family history.  Social History:  Social History   Socioeconomic History  . Marital status: Single    Spouse name: Not on file  . Number of children: Not on file  . Years of education: Not on file  . Highest education level: Not on file  Occupational History  . Not on file  Tobacco Use  . Smoking status: Former Games developer  . Smokeless tobacco: Never Used  Substance and Sexual Activity  . Alcohol use: Yes    Comment: occasional  . Drug use: No  . Sexual activity: Not on file  Other Topics Concern  . Not on file  Social History Narrative  . Not on file   Social Determinants of Health   Financial Resource Strain: Not on file  Food Insecurity: Not on file  Transportation Needs: Not on file  Physical Activity: Not on file  Stress: Not on file  Social Connections: Not on file    Allergies:  Allergies  Allergen Reactions  . Aspirin     Metabolic Disorder  Labs: No results found for: HGBA1C, MPG No results found for: PROLACTIN No results found for: CHOL, TRIG, HDL, CHOLHDL, VLDL, LDLCALC Lab Results  Component Value Date   TSH 0.661 05/23/2018    Therapeutic Level Labs: Lab Results  Component Value Date   LITHIUM 0.17 (L) 05/23/2018   No results found for: VALPROATE No components found for:  CBMZ  Current Medications: Current Outpatient Medications  Medication Sig Dispense Refill  . albuterol (PROVENTIL HFA;VENTOLIN HFA) 108 (90 Base) MCG/ACT inhaler Inhale 2 puffs into the lungs every 6 (six) hours as needed  for wheezing or shortness of breath. 1 Inhaler 0  . FLUoxetine (PROZAC) 40 MG capsule Take 1 capsule (40 mg total) by mouth daily. 30 capsule 2  . lithium carbonate 300 MG capsule TAKE 2 CAPSULES BY MOUTH TWICE A DAY WITH MEAL FOR MOOD CONTROL 120 capsule 2  . methocarbamol (ROBAXIN) 500 MG tablet Take 1 tablet (500 mg total) by mouth every 12 (twelve) hours as needed for muscle spasms. 12 tablet 0  . omeprazole (PRILOSEC OTC) 20 MG tablet Take 20 mg by mouth 2 (two) times daily.    . risperiDONE (RISPERDAL) 3 MG tablet Take 2 tablets (6 mg total) by mouth at bedtime. 60 tablet 2  . traZODone (DESYREL) 150 MG tablet Take 1 tablet (150 mg total) by mouth at bedtime as needed for sleep. 30 tablet 2   No current facility-administered medications for this visit.     Musculoskeletal: Strength & Muscle Tone: Unable to assess due to telephone visit Gait & Station: Unable to assess due to telephone visit Patient leans: N/A  Psychiatric Specialty Exam: Review of Systems  There were no vitals taken for this visit.There is no height or weight on file to calculate BMI.  General Appearance: Unable to assess due to telephone visit  Eye Contact:  Unable to assess due to telephone visit  Speech:  Clear and Coherent and Normal Rate  Volume:  Normal  Mood:  Euthymic  Affect:  Appropriate and Congruent  Thought Process:  Coherent, Goal Directed and Linear  Orientation:  Full (Time, Place, and Person)  Thought Content: WDL and Logical   Suicidal Thoughts:  No  Homicidal Thoughts:  No  Memory:  Immediate;   Good Recent;   Good Remote;   Good  Judgement:  Good  Insight:  Good  Psychomotor Activity:  Normal  Concentration:  Concentration: Good and Attention Span: Good  Recall:  Good  Fund of Knowledge: Good  Language: Good  Akathisia:  No  Handed:  Right  AIMS (if indicated): Not done  Assets:  Communication Skills Desire for Improvement Financial Resources/Insurance Housing Social Support   ADL's:  Intact  Cognition: WNL  Sleep:  Good   Screenings: GAD-7   Flowsheet Row Video Visit from 10/07/2020 in [facility]  Total GAD-7 Score 8    PHQ2-9   Flowsheet Row Video Visit from 10/07/2020 in [facility] Office Visit from 11/03/2017 in Primary Care at [facility] Total Score 4 1  PHQ-9 Total Score 12 --       Assessment and Plan: Patient notes that his anxiety and depression has improved since last visit.Patient has not gotten his lithium levels drawn.  Levels will be reviewed at next visit if patient has his levels redrawn.  No medication changes made today.  He is agreeable to continue all other medications as prescribed.  1. Bipolar I disorder (HCC)  Continue- lithium  carbonate 300 MG capsule; TAKE 2 CAPSULES BY MOUTH TWICE A DAY WITH MEAL FOR MOOD CONTROL  Dispense: 120 capsule; Refill: 2 Continue- risperiDONE (RISPERDAL) 3 MG tablet; Take 2 tablets (6 mg total) by mouth at bedtime.  Dispense: 60 tablet; Refill: 2 Continue- traZODone (DESYREL) 150 MG tablet; Take 1 tablet (150 mg total) by mouth at bedtime as needed for sleep.  Dispense: 30 tablet; Refill: 2 Continue- FLUoxetine (PROZAC) 40 MG capsule; Take 1 capsule (40 mg total) by mouth daily.  Dispense: 30 capsule; Refill: 2  Follow-up in 3 months Follow-up with therapy  Shanna Cisco, NP 10/07/2020, 3:07 PM

## 2021-01-06 ENCOUNTER — Other Ambulatory Visit (HOSPITAL_COMMUNITY): Payer: Self-pay | Admitting: Psychiatry

## 2021-01-06 ENCOUNTER — Other Ambulatory Visit: Payer: Self-pay

## 2021-01-06 ENCOUNTER — Telehealth (HOSPITAL_COMMUNITY): Payer: BLUE CROSS/BLUE SHIELD | Admitting: Psychiatry

## 2021-01-06 ENCOUNTER — Telehealth (INDEPENDENT_AMBULATORY_CARE_PROVIDER_SITE_OTHER): Payer: BLUE CROSS/BLUE SHIELD | Admitting: Psychiatry

## 2021-01-06 DIAGNOSIS — F319 Bipolar disorder, unspecified: Secondary | ICD-10-CM

## 2021-01-06 MED ORDER — RISPERIDONE 3 MG PO TABS: 6.0000 mg | ORAL_TABLET | Freq: Every day | ORAL | 2 refills | Status: DC

## 2021-01-06 MED ORDER — FLUOXETINE HCL 40 MG PO CAPS: 40.0000 mg | ORAL_CAPSULE | Freq: Every day | ORAL | 2 refills | Status: DC

## 2021-01-06 MED ORDER — LITHIUM CARBONATE 300 MG PO CAPS: ORAL_CAPSULE | ORAL | 2 refills | Status: DC

## 2021-01-06 MED ORDER — TRAZODONE HCL 150 MG PO TABS
150.0000 mg | ORAL_TABLET | Freq: Every evening | ORAL | 2 refills | Status: DC | PRN
Start: 2021-01-06 — End: 2021-05-30

## 2021-01-06 NOTE — Telephone Encounter (Signed)
Patient called clinic and noted that his appointment was supposed to be virtual instead of in person.  Provider attempted to call the patient back without response Hydrographic surveyor called 3 times and waited on virtual For 15 minutes).  Provider left voicemail informing patient that his medications will be refilled.  Provider also instructed patient to call the clinic to reschedule for further appointments.  No other concerns noted at this time.

## 2021-01-06 NOTE — Progress Notes (Deleted)
BH MD/PA/NP OP Progress Note Virtual Visit via Telephone Note  I connected with Charles Ross on 01/06/21 at  3:30 PM EDT by telephone and verified that I am speaking with the correct person using two identifiers.  Location: Patient: home Provider: Clinic   I discussed the limitations, risks, security and privacy concerns of performing an evaluation and management service by telephone and the availability of in person appointments. I also discussed with the patient that there may be a patient responsible charge related to this service. The patient expressed understanding and agreed to proceed.   I provided 30 minutes of non-face-to-face time during this encounter.   01/06/2021 4:12 PM Charles Ross  MRN:  [id number]  Chief Complaint: "I think things are going okay".  HPI: 50 year old male seen today for follow up psychiatric evaluation.    He is currently being managed on lithium 600 mg twice daily, Prozac 40 mg daily, trazodone 150 mg nightly, Risperdal 6 mg at bedtime.  He notes that his medications are effective in managing his psychiatric conditions.   Today patient unable to login virtually so his exam was done over the phone.  During exam he is pleasant, cooperative, and engaged in conversation.  He informed provider that he has minimal anxiety and depression and notes that he feels like his medications are effective in managing his psychiatric conditions. Today provider conducted a GAD-7 and patient scored an 8.  Provider also conducted a PHQ 9 and patient scored a 12.  He endorses adequate sleep and appetite.  He denies SI/HI/VAH, paranoia, or mania.   Patient has not gotten his lithium levels drawn.  Levels will be reviewed at next visit if patient has his levels redrawn.  No medication changes made today.  He is agreeable to continue all other medications as prescribed. No other concerns noted at this time.  Visit Diagnosis:  No diagnosis found.  Past Psychiatric History:  bipolar disorder  Past Medical History:  Past Medical History:  Diagnosis Date  . Bipolar 1 disorder [facility])     Past Surgical History:  Procedure Laterality Date  . HIATAL HERNIA REPAIR      Family Psychiatric History: Denies  Family History: No family history on file.  Social History:  Social History   Socioeconomic History  . Marital status: Single    Spouse name: Not on file  . Number of children: Not on file  . Years of education: Not on file  . Highest education level: Not on file  Occupational History  . Not on file  Tobacco Use  . Smoking status: Former Games developer  . Smokeless tobacco: Never Used  Substance and Sexual Activity  . Alcohol use: Yes    Comment: occasional  . Drug use: No  . Sexual activity: Not on file  Other Topics Concern  . Not on file  Social History Narrative  . Not on file   Social Determinants of Health   Financial Resource Strain: Not on file  Food Insecurity: Not on file  Transportation Needs: Not on file  Physical Activity: Not on file  Stress: Not on file  Social Connections: Not on file    Allergies:  Allergies  Allergen Reactions  . Aspirin     Metabolic Disorder Labs: No results found for: HGBA1C, MPG No results found for: PROLACTIN No results found for: CHOL, TRIG, HDL, CHOLHDL, VLDL, LDLCALC Lab Results  Component Value Date   TSH 0.661 05/23/2018    Therapeutic Level Labs: Lab Results  Component Value Date   LITHIUM 0.17 (L) 05/23/2018   No results found for: VALPROATE No components found for:  CBMZ  Current Medications: Current Outpatient Medications  Medication Sig Dispense Refill  . albuterol (PROVENTIL HFA;VENTOLIN HFA) 108 (90 Base) MCG/ACT inhaler Inhale 2 puffs into the lungs every 6 (six) hours as needed for wheezing or shortness of breath. 1 Inhaler 0  . FLUoxetine (PROZAC) 40 MG capsule Take 1 capsule (40 mg total) by mouth daily. 30 capsule 2  . lithium carbonate 300 MG capsule TAKE 2 CAPSULES BY  MOUTH TWICE A DAY WITH MEAL FOR MOOD CONTROL 120 capsule 2  . methocarbamol (ROBAXIN) 500 MG tablet Take 1 tablet (500 mg total) by mouth every 12 (twelve) hours as needed for muscle spasms. 12 tablet 0  . omeprazole (PRILOSEC OTC) 20 MG tablet Take 20 mg by mouth 2 (two) times daily.    . risperiDONE (RISPERDAL) 3 MG tablet Take 2 tablets (6 mg total) by mouth at bedtime. 60 tablet 2  . traZODone (DESYREL) 150 MG tablet Take 1 tablet (150 mg total) by mouth at bedtime as needed for sleep. 30 tablet 2   No current facility-administered medications for this visit.     Musculoskeletal: Strength & Muscle Tone: Unable to assess due to telephone visit Gait & Station: Unable to assess due to telephone visit Patient leans: N/A  Psychiatric Specialty Exam: Review of Systems  There were no vitals taken for this visit.There is no height or weight on file to calculate BMI.  General Appearance: Unable to assess due to telephone visit  Eye Contact:  Unable to assess due to telephone visit  Speech:  Clear and Coherent and Normal Rate  Volume:  Normal  Mood:  Euthymic  Affect:  Appropriate and Congruent  Thought Process:  Coherent, Goal Directed and Linear  Orientation:  Full (Time, Place, and Person)  Thought Content: WDL and Logical   Suicidal Thoughts:  No  Homicidal Thoughts:  No  Memory:  Immediate;   Good Recent;   Good Remote;   Good  Judgement:  Good  Insight:  Good  Psychomotor Activity:  Normal  Concentration:  Concentration: Good and Attention Span: Good  Recall:  Good  Fund of Knowledge: Good  Language: Good  Akathisia:  No  Handed:  Right  AIMS (if indicated): Not done  Assets:  Communication Skills Desire for Improvement Financial Resources/Insurance Housing Social Support  ADL's:  Intact  Cognition: WNL  Sleep:  Good   Screenings: GAD-7   Flowsheet Row Video Visit from 10/07/2020 in [facility]  Total GAD-7 Score 8    PHQ2-9    Flowsheet Row Video Visit from 10/07/2020 in [facility] Office Visit from 11/03/2017 in [facility] Total Score 4 1  PHQ-9 Total Score 12 --       Assessment and Plan: Patient notes that his anxiety and depression has improved since last visit.Patient has not gotten his lithium levels drawn.  Levels will be reviewed at next visit if patient has his levels redrawn.  No medication changes made today.  He is agreeable to continue all other medications as prescribed.  1. Bipolar I disorder (HCC)  Continue- lithium carbonate 300 MG capsule; TAKE 2 CAPSULES BY MOUTH TWICE A DAY WITH MEAL FOR MOOD CONTROL  Dispense: 120 capsule; Refill: 2 Continue- risperiDONE (RISPERDAL) 3 MG tablet; Take 2 tablets (6 mg total) by mouth at bedtime.  Dispense: 60  tablet; Refill: 2 Continue- traZODone (DESYREL) 150 MG tablet; Take 1 tablet (150 mg total) by mouth at bedtime as needed for sleep.  Dispense: 30 tablet; Refill: 2 Continue- FLUoxetine (PROZAC) 40 MG capsule; Take 1 capsule (40 mg total) by mouth daily.  Dispense: 30 capsule; Refill: 2  Follow-up in 3 months Follow-up with therapy  Shanna Cisco, NP 01/06/2021, 4:12 PM

## 2021-04-02 ENCOUNTER — Telehealth (HOSPITAL_COMMUNITY): Payer: Self-pay | Admitting: *Deleted

## 2021-04-02 NOTE — Telephone Encounter (Signed)
Call from patient seeking rxs. Reviewed record, he missed his last appt in April. He does not have an appt for the future scheduled. Moved his call to the front desk to schedule him, once he has an appt I will confirm with his provider if she is willing to bridge him medicines till his appt. Told him if he misses again he will have to be seen before meds are ordered. He verbalized his understanding. Will check with his provider.

## 2021-04-02 NOTE — Telephone Encounter (Signed)
Opened a second time in error. 

## 2021-04-07 ENCOUNTER — Other Ambulatory Visit: Payer: Self-pay

## 2021-04-07 ENCOUNTER — Encounter (HOSPITAL_COMMUNITY): Payer: Self-pay

## 2021-04-07 ENCOUNTER — Emergency Department (HOSPITAL_COMMUNITY)
Admission: EM | Admit: 2021-04-07 | Discharge: 2021-04-08 | Disposition: A | Discharge status: Home / Self Care | Payer: No Typology Code available for payment source | Attending: Emergency Medicine | Admitting: Emergency Medicine

## 2021-04-07 DIAGNOSIS — X58XXXA Exposure to other specified factors, initial encounter: Secondary | ICD-10-CM | POA: Diagnosis not present

## 2021-04-07 DIAGNOSIS — S39012A Strain of muscle, fascia and tendon of lower back, initial encounter: Secondary | ICD-10-CM | POA: Diagnosis not present

## 2021-04-07 DIAGNOSIS — Z87891 Personal history of nicotine dependence: Secondary | ICD-10-CM | POA: Diagnosis not present

## 2021-04-07 DIAGNOSIS — M545 Low back pain, unspecified: Secondary | ICD-10-CM

## 2021-04-07 DIAGNOSIS — S34109A Unspecified injury to unspecified level of lumbar spinal cord, initial encounter: Secondary | ICD-10-CM | POA: Diagnosis present

## 2021-04-07 NOTE — ED Provider Notes (Signed)
Emergency Medicine Provider Triage Evaluation Note  Charles Ross , a 50 y.o. male  was evaluated in triage.  Pt complains of back pain that started today after sweeping.  Review of Systems  Positive: Back pain Negative: fever  Physical Exam  BP 114/72   Pulse 79   Temp 98.6 F (37 C) (Oral)   Resp 16   Ht 6\' 1"  (1.854 m)   Wt 79.4 kg   SpO2 100%   BMI 23.09 kg/m  Gen:   Awake, no distress   Resp:  Normal effort  MSK:   Moves extremities without difficulty Other:    Medical Decision Making  Medically screening exam initiated at 10:57 PM.  Appropriate orders placed.  Charles Ross was informed that the remainder of the evaluation will be completed by another provider, this initial triage assessment does not replace that evaluation, and the importance of remaining in the ED until their evaluation is complete.     [name] 04/07/21 2258    [id number], MD 04/08/21 1010

## 2021-04-07 NOTE — ED Triage Notes (Signed)
Pt complains of back pain. 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

## 2021-04-08 MED ORDER — CYCLOBENZAPRINE HCL 10 MG PO TABS: 10.0000 mg | ORAL_TABLET | Freq: Two times a day (BID) | ORAL | 0 refills | Status: AC | PRN

## 2021-04-08 MED ORDER — LIDOCAINE 5 % EX PTCH: 1.0000 | MEDICATED_PATCH | CUTANEOUS | 0 refills | Status: AC

## 2021-04-08 NOTE — ED Provider Notes (Signed)
[facility]-EM[facility]GENCY DEPT Provider Note   CSN: [id number] Arrival date & time: 04/07/21  2157     History Chief Complaint  Patient presents with   Back Pain    Charles Ross is a 50 y.o. male.  The history is provided by the patient and medical records. No language interpreter was used.  Back Pain Location:  Lumbar spine Quality:  Cramping Radiates to:  Does not radiate Pain severity:  Moderate Pain is:  Same all the time Onset quality:  Sudden Duration:  1 day Timing:  Constant Progression:  Waxing and waning Chronicity:  New Context: recent injury   Relieved by:  Being still Worsened by:  Movement, twisting and bending Ineffective treatments:  None tried Associated symptoms: no abdominal pain, no bladder incontinence, no bowel incontinence, no chest pain, no dysuria, no fever, no headaches, no leg pain, no numbness, no paresthesias, no pelvic pain, no tingling and no weakness       Past Medical History:  Diagnosis Date   Bipolar 1 disorder [facility])     Patient Active Problem List   Diagnosis Date Noted   Bipolar I disorder (HCC) 05/24/2018   Enteritis     Past Surgical History:  Procedure Laterality Date   HIATAL HERNIA REPAIR         History reviewed. No pertinent family history.  Social History   Tobacco Use   Smoking status: Former    Pack years: 0.00   Smokeless tobacco: Never  Substance Use Topics   Alcohol use: Yes    Comment: occasional   Drug use: No    Home Medications Prior to Admission medications   Medication Sig Start Date End Date Taking? Authorizing Provider  albuterol (PROVENTIL HFA;VENTOLIN HFA) 108 (90 Base) MCG/ACT inhaler Inhale 2 puffs into the lungs every 6 (six) hours as needed for wheezing or shortness of breath. 11/03/17   Ofilia Neas, PA-C  FLUoxetine (PROZAC) 40 MG capsule Take 1 capsule (40 mg total) by mouth daily. 01/06/21   Shanna Cisco, NP  lithium carbonate 300 MG capsule TAKE 2  CAPSULES BY MOUTH TWICE A DAY WITH MEAL FOR MOOD CONTROL 01/06/21   Shanna Cisco, NP  methocarbamol (ROBAXIN) 500 MG tablet Take 1 tablet (500 mg total) by mouth every 12 (twelve) hours as needed for muscle spasms. 04/11/19   Antony Madura, PA-C  omeprazole (PRILOSEC OTC) 20 MG tablet Take 20 mg by mouth 2 (two) times daily.    [provider]  risperiDONE (RISPERDAL) 3 MG tablet Take 2 tablets (6 mg total) by mouth at bedtime. 01/06/21   Shanna Cisco, NP  traZODone (DESYREL) 150 MG tablet Take 1 tablet (150 mg total) by mouth at bedtime as needed for sleep. 01/06/21   Shanna Cisco, NP    Allergies    Aspirin  Review of Systems   Review of Systems  Constitutional:  Negative for chills, fatigue and fever.  HENT:  Negative for congestion.   Respiratory:  Negative for cough, chest tightness, shortness of breath and wheezing.   Cardiovascular:  Negative for chest pain.  Gastrointestinal:  Negative for abdominal pain, bowel incontinence, constipation, diarrhea, nausea and vomiting.  Genitourinary:  Negative for bladder incontinence, dysuria, flank pain and pelvic pain.  Musculoskeletal:  Positive for back pain. Negative for neck pain and neck stiffness.  Skin:  Negative for rash.  Neurological:  Negative for tingling, tremors, weakness, light-headedness, numbness, headaches and paresthesias.  Psychiatric/Behavioral:  Negative for  agitation.   All other systems reviewed and are negative.  Physical Exam Updated Vital Signs BP 137/79   Pulse 75   Temp 98.6 F (37 C) (Oral)   Resp 16   Ht 6\' 1"  (1.854 m)   Wt 79.4 kg   SpO2 100%   BMI 23.09 kg/m   Physical Exam Vitals and nursing note reviewed.  Constitutional:      General: He is not in acute distress.    Appearance: He is well-developed. He is not ill-appearing, toxic-appearing or diaphoretic.  HENT:     Head: Normocephalic and atraumatic.     Mouth/Throat:     Mouth: Mucous membranes are moist.  Eyes:      Conjunctiva/sclera: Conjunctivae normal.  Cardiovascular:     Rate and Rhythm: Normal rate and regular rhythm.     Pulses: Normal pulses.     Heart sounds: No murmur heard. Pulmonary:     Effort: Pulmonary effort is normal. No respiratory distress.     Breath sounds: Normal breath sounds. No wheezing, rhonchi or rales.  Chest:     Chest wall: No tenderness.  Abdominal:     Palpations: Abdomen is soft.     Tenderness: There is no abdominal tenderness. There is no right CVA tenderness, left CVA tenderness, guarding or rebound.  Musculoskeletal:        General: Tenderness present.     Cervical back: Neck supple. No tenderness.     Lumbar back: Spasms and tenderness present. No swelling, deformity, signs of trauma or lacerations.       Back:     Right lower leg: No edema.     Left lower leg: No edema.  Skin:    General: Skin is warm and dry.     Capillary Refill: Capillary refill takes less than 2 seconds.  Neurological:     General: No focal deficit present.     Mental Status: He is alert.  Psychiatric:        Mood and Affect: Mood normal.    ED Results / Procedures / Treatments   Labs (all labs ordered are listed, but only abnormal results are displayed) Labs Reviewed - No data to display  EKG None  Radiology No results found.  Procedures Procedures   Medications Ordered in ED Medications - No data to display  ED Course  I have reviewed the triage vital signs and the nursing notes.  Pertinent labs & imaging results that were available during my care of the patient were reviewed by me and considered in my medical decision making (see chart for details).    MDM Rules/Calculators/A&P                          Charles Ross is a 50 y.o. male with a past medical history significant for bipolar disorder who presents with back injury.  Patient reports that just about 24 hours ago, he was sweeping out a floor under some shelves when he had onset of low back pain.   He reports it is an aching pain and cramping pain and is worse when he twists or bends or recreates the same maneuver.  He denies any falls or other trauma.  He denies any numbness, tingling, weakness.  Denies any loss of bowel or bladder control.  Denies any history of previous back injury or back surgeries.  Denies any urinary symptoms or flank pain.  Denies history of kidney stones.  Reports  the pain is moderate and worsened when he tries to do things.  At rest it is improved.  Denies other complaints and is still able to ambulate without difficulty.  On exam, lungs clear and chest nontender.  Abdomen nontender.  No flank tenderness.  No rash seen.  Back is tender in the lumbar area with some muscle spasms palpated.  Normal sensation and strength in extremities.  Normal gait.  As shared decision-making conversation with the patient as I suspect is more of a musculoskeletal type pain in his low back.  As there was some tenderness in the middle of the lumbar spine, we did offer imaging such as x-ray but he reports he would rather wait for that.  Patient agrees that it is likely a muscle spasm and would rather do prescription for muscle relaxant and Lidoderm patches and follow-up with a PCP.  Patient will also be instructed to return if symptoms do not improve or worsen for reassessment.  Low suspicion for bony injury at this time and no red flags for cord injury.  Low suspicion for a urinary etiology or kidney etiology.  Patient agrees with plan of care and was discharged in good condition.         Final Clinical Impression(s) / ED Diagnoses Final diagnoses:  Acute bilateral low back pain without sciatica  Strain of lumbar region, initial encounter    Rx / DC Orders ED Discharge Orders          Ordered    cyclobenzaprine (FLEXERIL) 10 MG tablet  2 times daily PRN        04/08/21 0336    lidocaine (LIDODERM) 5 %  Every 24 hours        04/08/21 0336            Clinical Impression: 1.  Acute bilateral low back pain without sciatica   2. Strain of lumbar region, initial encounter     Disposition: Discharge  Condition: Good  I have discussed the results, Dx and Tx plan with the pt(& family if present). He/she/they expressed understanding and agree(s) with the plan. Discharge instructions discussed at great length. Strict return precautions discussed and pt &/or family have verbalized understanding of the instructions. No further questions at time of discharge.    New Prescriptions   CYCLOBENZAPRINE (FLEXERIL) 10 MG TABLET    Take 1 tablet (10 mg total) by mouth 2 (two) times daily as needed for muscle spasms.   LIDOCAINE (LIDODERM) 5 %    Place 1 patch onto the skin daily. Remove & Discard patch within 12 hours or as directed by MD    Follow Up: [facility] [address] [place] [address] [phone] Schedule an appointment as soon as possible for a visit    [facility] [id number] [place] [address] [phone]       Jarae Nemmers, Canary Brim, MD 04/08/21 [phone]

## 2021-04-08 NOTE — Discharge Instructions (Addendum)
Your history and exam today are consistent with spraining your back and associated muscle spasm and discomfort.  We had a shared decision-making conversation and together agreed to hold on imaging at this time however symptoms change or worsen or you develop other concerning symptoms, you will need to return for further work-up.  Please use the muscle relaxant medication as well as the numbing patches to help with discomfort and please follow-up with a primary doctor.  If any symptoms change or worsen, please return to the nearest emergency department.

## 2021-05-17 IMAGING — CR DG FOOT COMPLETE 3+V*R*
3 series · 3 of 3 positions shown · non-contrast
Comparison: None.

CLINICAL DATA: Foot pain

EXAM:
RIGHT FOOT COMPLETE - 3+ VIEW

[foot ap]
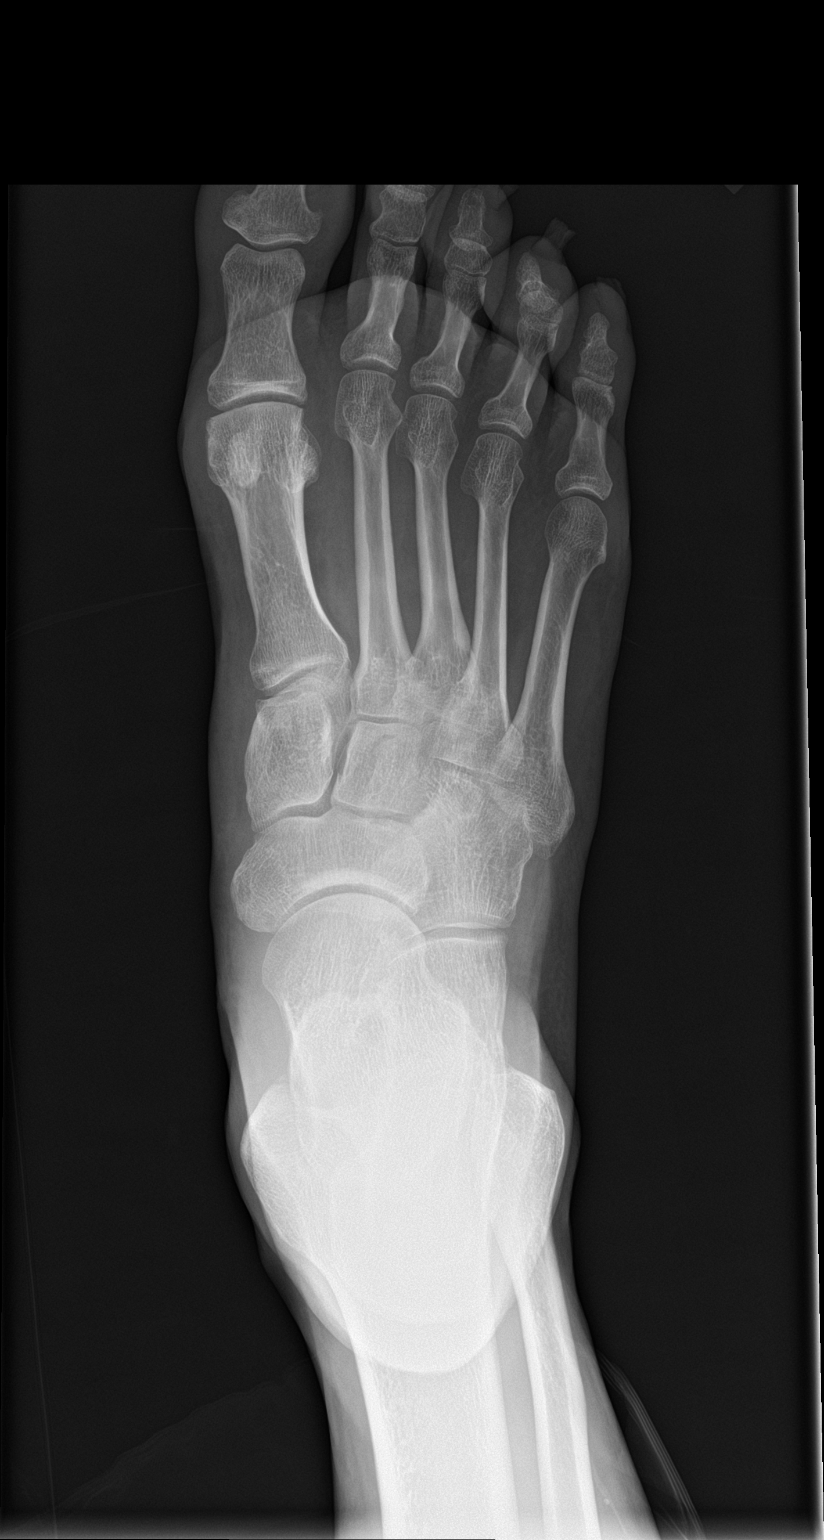

[foot obl]
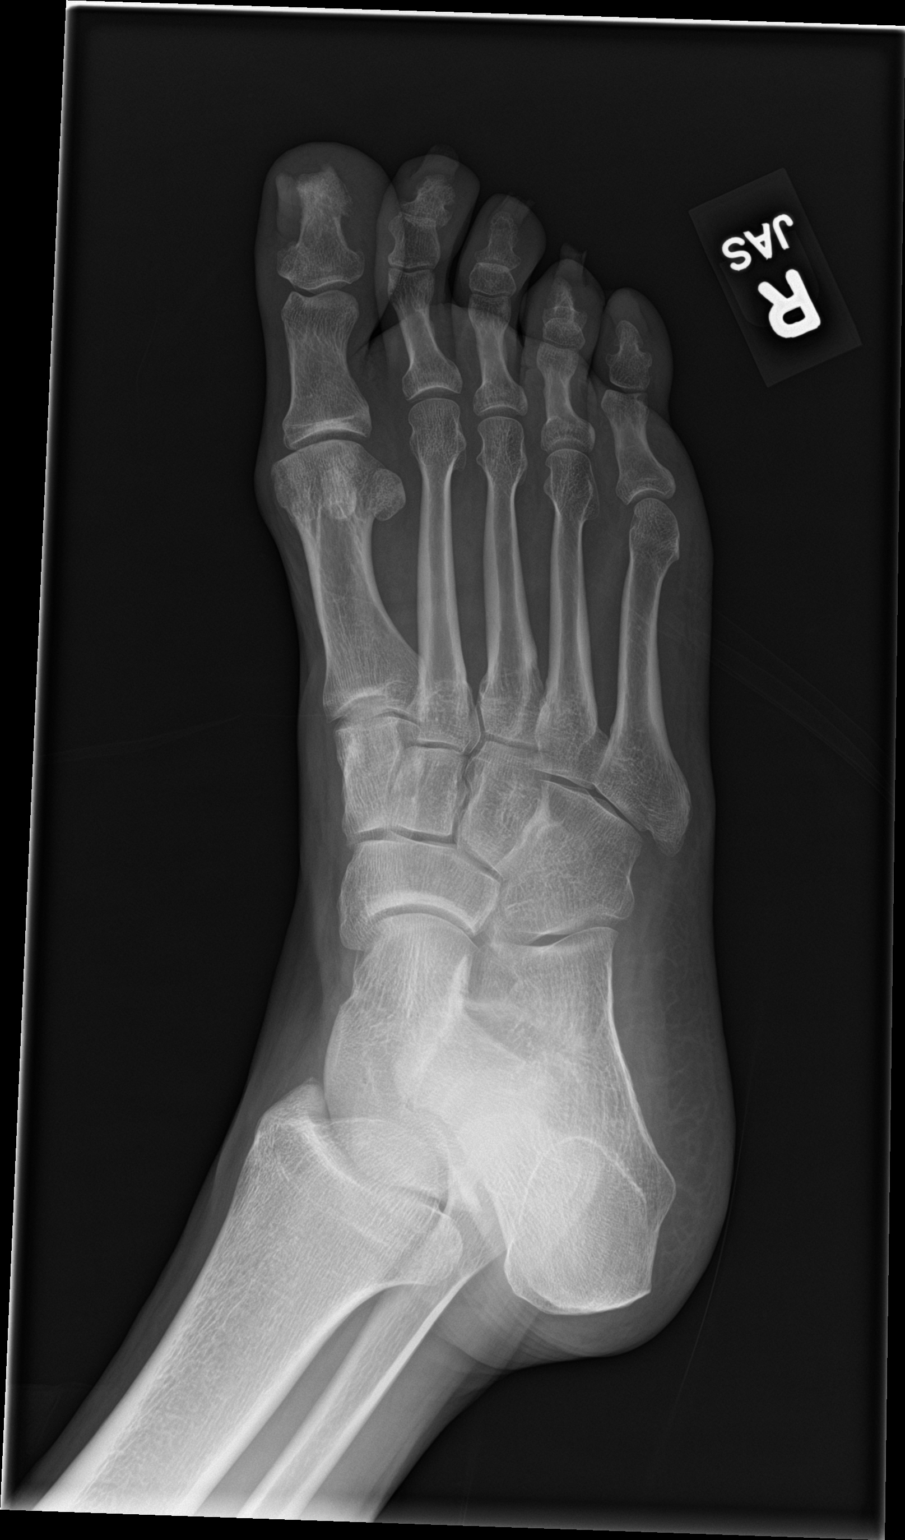

[foot lat]
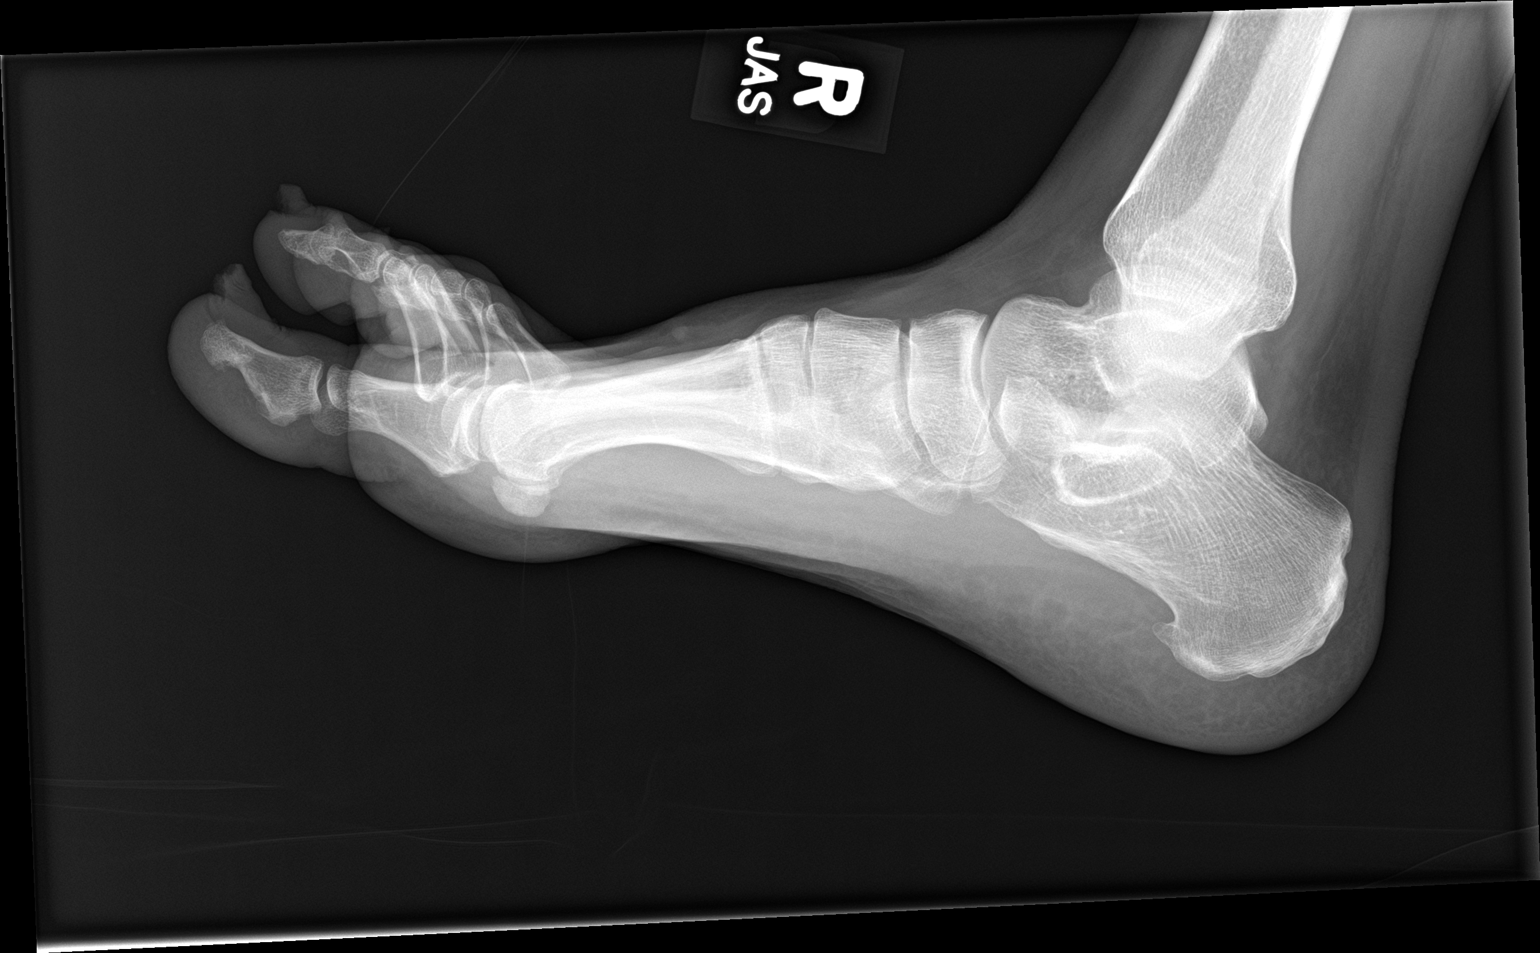

[3 of 3 positions shown; findings below may reference images not displayed]

FINDINGS: There is no evidence of fracture or dislocation. There is no
evidence of arthropathy or other focal bone abnormality. Soft
tissues are unremarkable. Mild calcaneal spur is noted.
IMPRESSION: No acute abnormality noted.

## 2021-05-29 ENCOUNTER — Other Ambulatory Visit: Payer: Self-pay

## 2021-05-29 ENCOUNTER — Telehealth (HOSPITAL_COMMUNITY): Payer: Self-pay | Admitting: Psychiatry

## 2021-05-30 ENCOUNTER — Encounter (HOSPITAL_COMMUNITY): Payer: Self-pay | Admitting: Psychiatry

## 2021-05-30 ENCOUNTER — Telehealth (INDEPENDENT_AMBULATORY_CARE_PROVIDER_SITE_OTHER): Payer: BLUE CROSS/BLUE SHIELD | Admitting: Psychiatry

## 2021-05-30 DIAGNOSIS — F5105 Insomnia due to other mental disorder: Secondary | ICD-10-CM | POA: Diagnosis not present

## 2021-05-30 DIAGNOSIS — F319 Bipolar disorder, unspecified: Secondary | ICD-10-CM

## 2021-05-30 MED ORDER — LITHIUM CARBONATE 300 MG PO CAPS: ORAL_CAPSULE | ORAL | 3 refills | Status: DC

## 2021-05-30 MED ORDER — FLUOXETINE HCL 40 MG PO CAPS: 40.0000 mg | ORAL_CAPSULE | Freq: Every day | ORAL | 3 refills | Status: DC

## 2021-05-30 MED ORDER — TRAZODONE HCL 100 MG PO TABS: 100.0000 mg | ORAL_TABLET | Freq: Every evening | ORAL | 3 refills | Status: DC | PRN

## 2021-05-30 MED ORDER — RISPERIDONE 3 MG PO TABS: 6.0000 mg | ORAL_TABLET | Freq: Every day | ORAL | 3 refills | Status: DC

## 2021-05-30 NOTE — Progress Notes (Signed)
BH MD/PA/NP OP Progress Note Virtual Visit via Telephone Note  I connected with Charles Ross on 05/30/21 at 11:00 AM EDT by telephone and verified that I am speaking with the correct person using two identifiers.  Location: Patient: home Provider: Clinic   I discussed the limitations, risks, security and privacy concerns of performing an evaluation and management service by telephone and the availability of in person appointments. I also discussed with the patient that there may be a patient responsible charge related to this service. The patient expressed understanding and agreed to proceed.   I provided 30 minutes of non-face-to-face time during this encounter.   05/30/2021 11:53 AM Charles Ross  MRN:  [id number]  Chief Complaint: "I have not been able to sleep".  HPI: 50 year old male seen today for follow up psychiatric evaluation.    He is currently being managed on lithium 600 mg twice daily, Prozac 40 mg daily, trazodone 150 mg nightly, Risperdal 6 mg at bedtime.  He notes that his medications are somewhat effective in managing his psychiatric conditions.    Today patient unable to login virtually so his exam was done over the phone.  During exam he is pleasant, cooperative, and engaged in conversation.  He informed provider that since his last visit he has been having problems sleeping. He notes some nights he is unable to sleep at all. Provider asked patient if there were significant life changes that may be causing his insomnia and he notes that there was not. He notes his mood is stable and reports that he has minimal anxiety and depression. Today provider conducted a GAD-7 and patient scored a 1, at his last visit he scored an 8.  Provider also conducted a PHQ 9 and patient scored a  4, at his last visit he scored a 12.  He endorses adequate appetite.  He denies SI/HI/VAH, paranoia, or mania.    Patient has not gotten his lithium levels drawn.  Levels will be reviewed at next  visit if patient has his levels redrawn. Provider encouraged patient to have his levels redrawn which he was agreeable. Today he is agreeable to increasing trazodone 150 mg to 200 mg as needed. Patient notes that when his sleep is regulated he can reduce his does to 100 mg as needed he endorsed understanding and agreed. No other medication changes made today.  He is agreeable to continue all other medications as prescribed. No other concerns noted at this time.  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 Bipolar I disorder (HCC)  F31.9 FLUoxetine (PROZAC) 40 MG capsule    lithium carbonate 300 MG capsule    risperiDONE (RISPERDAL) 3 MG tablet    traZODone (DESYREL) 100 MG tablet      Past Psychiatric History: bipolar disorder  Past Medical History:  Past Medical History:  Diagnosis Date   Bipolar 1 disorder (HCC)     Past Surgical History:  Procedure Laterality Date   HIATAL HERNIA REPAIR      Family Psychiatric History: Denies  Family History: No family history on file.  Social History:  Social History   Socioeconomic History   Marital status: Single    Spouse name: Not on file   Number of children: Not on file   Years of education: Not on file   Highest education level: Not on file  Occupational History   Not on file  Tobacco Use   Smoking status: Former   Smokeless tobacco: Never  Substance and Sexual Activity   Alcohol use: Yes    Comment: occasional   Drug use: No   Sexual activity: Not on file  Other Topics Concern   Not on file  Social History Narrative   Not on file   Social Determinants of Health   Financial Resource Strain: Not on file  Food Insecurity: Not on file  Transportation Needs: Not on file  Physical Activity: Not on file  Stress: Not on file  Social Connections: Not on file    Allergies:  Allergies  Allergen Reactions   Aspirin     Metabolic Disorder  Labs: No results found for: HGBA1C, MPG No results found for: PROLACTIN No results found for: CHOL, TRIG, HDL, CHOLHDL, VLDL, LDLCALC Lab Results  Component Value Date   TSH 0.661 05/23/2018    Therapeutic Level Labs: Lab Results  Component Value Date   LITHIUM 0.17 (L) 05/23/2018   No results found for: VALPROATE No components found for:  CBMZ  Current Medications: Current Outpatient Medications  Medication Sig Dispense Refill   albuterol (PROVENTIL HFA;VENTOLIN HFA) 108 (90 Base) MCG/ACT inhaler Inhale 2 puffs into the lungs every 6 (six) hours as needed for wheezing or shortness of breath. 1 Inhaler 0   cyclobenzaprine (FLEXERIL) 10 MG tablet Take 1 tablet (10 mg total) by mouth 2 (two) times daily as needed for muscle spasms. 20 tablet 0   FLUoxetine (PROZAC) 40 MG capsule Take 1 capsule (40 mg total) by mouth daily. 30 capsule 3   lidocaine (LIDODERM) 5 % Place 1 patch onto the skin daily. Remove & Discard patch within 12 hours or as directed by MD 15 patch 0   lithium carbonate 300 MG capsule TAKE 2 CAPSULES BY MOUTH TWICE A DAY WITH MEAL FOR MOOD CONTROL 120 capsule 3   methocarbamol (ROBAXIN) 500 MG tablet Take 1 tablet (500 mg total) by mouth every 12 (twelve) hours as needed for muscle spasms. 12 tablet 0   omeprazole (PRILOSEC OTC) 20 MG tablet Take 20 mg by mouth 2 (two) times daily.     risperiDONE (RISPERDAL) 3 MG tablet Take 2 tablets (6 mg total) by mouth at bedtime. 60 tablet 3   traZODone (DESYREL) 100 MG tablet Take 1-2 tablets (100-200 mg total) by mouth at bedtime as needed for sleep. 60 tablet 3   No current facility-administered medications for this visit.     Musculoskeletal: Strength & Muscle Tone:  Unable to assess due to telephone visit Gait & Station:  Unable to assess due to telephone visit Patient leans: N/A  Psychiatric Specialty Exam: Review of Systems  There were no vitals taken for this visit.There is no height or weight on file to calculate  BMI.  General Appearance:  Unable to assess due to telephone visit  Eye Contact:   Unable to assess due to telephone visit  Speech:  Clear and Coherent and Normal Rate  Volume:  Normal  Mood:  Euthymic  Affect:  Appropriate and Congruent  Thought Process:  Coherent, Goal Directed and Linear  Orientation:  Full (Time, Place, and Person)  Thought Content: WDL and Logical   Suicidal Thoughts:  No  Homicidal Thoughts:  No  Memory:  Immediate;   Good Recent;   Good Remote;   Good  Judgement:  Good  Insight:  Good  Psychomotor Activity:  Normal  Concentration:  Concentration: Good and Attention Span: Good  Recall:  Good  Fund of Knowledge: Good  Language: Good  Akathisia:  No  Handed:  Right  AIMS (if indicated): Not done  Assets:  Communication Skills Desire for Improvement Financial Resources/Insurance Housing Social Support  ADL's:  Intact  Cognition: WNL  Sleep:  Poor   Screenings: GAD-7    Flowsheet Row Video Visit from 05/30/2021 in [facility] Video Visit from 10/07/2020 in [facility]  Total GAD-7 Score 1 8      PHQ2-9    Flowsheet Row Video Visit from 05/30/2021 in [facility] Video Visit from 10/07/2020 in [facility] Office Visit from 11/03/2017 in Primary Care at [facility] Total Score 1 4 1   PHQ-9 Total Score 4 12 --      Flowsheet Row ED from 04/07/2021 in [facility]  C-SSRS RISK CATEGORY No Risk        Assessment and Plan: Patient notes that his anxiety and depression is well managed however notes that he has been having insomnia. Patient has not gotten his lithium levels drawn.  Levels will be reviewed at next visit if patient has his levels redrawn. Provider encouraged patient to have his levels redrawn which he was agreeable. Today he is agreeable to increasing trazodone 150 mg to 200 mg as needed. Patient  notes that when his sleep is regulated he can reduce his does to 100 mg as needed he endorsed understanding and agreed. No other medication changes made today.  He is agreeable to continue all other medications as prescribed. 1. Bipolar I disorder (HCC)  Continue- lithium carbonate 300 MG capsule; TAKE 2 CAPSULES BY MOUTH TWICE A DAY WITH MEAL FOR MOOD CONTROL  Dispense: 120 capsule; Refill: 3 Continue- risperiDONE (RISPERDAL) 3 MG tablet; Take 2 tablets (6 mg total) by mouth at bedtime.  Dispense: 60 tablet; Refill: 3 Increased- traZODone (DESYREL) 150 MG tablet; Take 1 tablet (150 mg total) by mouth at bedtime as needed for sleep.  Dispense: 30 tablet; Refill: 3 Continue- FLUoxetine (PROZAC) 40 MG capsule; Take 1 capsule (40 mg total) by mouth daily.  Dispense: 30 capsule; Refill: 3   2. Insomnia due to mental condition  Increased- traZODone (DESYREL) 100 MG tablet; Take 1-2 tablets (100-200 mg total) by mouth at bedtime as needed for sleep.  Dispense: 60 tablet; Refill: 3   Follow-up in 3 months Follow-up with therapy  06/08/2021, NP 05/30/2021, 11:53 AM

## 2021-09-04 ENCOUNTER — Telehealth (HOSPITAL_COMMUNITY): Payer: Self-pay | Admitting: Psychiatry

## 2021-10-07 ENCOUNTER — Other Ambulatory Visit (HOSPITAL_COMMUNITY): Payer: Self-pay | Admitting: Psychiatry

## 2021-10-07 DIAGNOSIS — F5105 Insomnia due to other mental disorder: Secondary | ICD-10-CM

## 2021-10-07 DIAGNOSIS — F319 Bipolar disorder, unspecified: Secondary | ICD-10-CM

## 2021-11-10 ENCOUNTER — Other Ambulatory Visit (HOSPITAL_COMMUNITY): Payer: Self-pay | Admitting: Psychiatry

## 2021-11-10 DIAGNOSIS — F319 Bipolar disorder, unspecified: Secondary | ICD-10-CM

## 2021-11-21 ENCOUNTER — Telehealth (INDEPENDENT_AMBULATORY_CARE_PROVIDER_SITE_OTHER): Payer: No Payment, Other | Admitting: Psychiatry

## 2021-11-21 ENCOUNTER — Encounter (HOSPITAL_COMMUNITY): Payer: Self-pay | Admitting: Psychiatry

## 2021-11-21 DIAGNOSIS — F319 Bipolar disorder, unspecified: Secondary | ICD-10-CM

## 2021-11-21 DIAGNOSIS — F5105 Insomnia due to other mental disorder: Secondary | ICD-10-CM | POA: Diagnosis not present

## 2021-11-21 MED ORDER — TRAZODONE HCL 100 MG PO TABS: 250.0000 mg | ORAL_TABLET | Freq: Every evening | ORAL | 3 refills | Status: DC | PRN

## 2021-11-21 MED ORDER — RISPERIDONE 3 MG PO TABS: 6.0000 mg | ORAL_TABLET | Freq: Every day | ORAL | 3 refills | Status: DC

## 2021-11-21 MED ORDER — LITHIUM CARBONATE 300 MG PO CAPS: ORAL_CAPSULE | ORAL | 3 refills | Status: DC

## 2021-11-21 MED ORDER — FLUOXETINE HCL 40 MG PO CAPS: 40.0000 mg | ORAL_CAPSULE | Freq: Every day | ORAL | 3 refills | Status: DC

## 2021-11-21 NOTE — Progress Notes (Signed)
BH MD/PA/NP OP Progress Note Virtual Visit via Telephone Note  I connected with Charles Ross on 11/21/21 at 11:30 AM EST by telephone and verified that I am speaking with the correct person using two identifiers.  Location: Patient: home Provider: Clinic   I discussed the limitations, risks, security and privacy concerns of performing an evaluation and management service by telephone and the availability of in person appointments. I also discussed with the patient that there may be a patient responsible charge related to this service. The patient expressed understanding and agreed to proceed.   I provided 30 minutes of non-face-to-face time during this encounter.   11/21/2021 11:16 AM Charles Ross  MRN:  [id number]  Chief Complaint: "I still have have problems sleeping".  HPI: 51 year old male seen today for follow up psychiatric evaluation.    He is currently being managed on lithium 600 mg twice daily, Prozac 40 mg daily, trazodone 200 mg nightly, Risperdal 6 mg at bedtime.  He notes that his medications are somewhat effective in managing his psychiatric conditions.    Today patient unable to login virtually so his exam was done over the phone.  During exam he is pleasant, cooperative, and engaged in conversation.  He informed provider that since his last visit he continues to have been having problems sleeping. He notes that his sleep fluctuates between 3 and 7 hours.  He notes that he would like to increase trazodone to 300.  Provider informed patient that Prozac and trazodone together can potentially cause serotonin syndrome.  He denies current symptoms of serotonin syndrome.    Patient reports that his mood is stable and also he has minimal anxiety and depression.  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  He notes that he is looking for a new job.  Provider conducted a GAD-7 and patient scored an 11, at his last visit he scored a 1.  Provider  also conducted PHQ-9 and patient scored an 8, at his last visit he scored a 4.  He endorses adequate appetite.  He denies SI/HI/VAH, paranoia, or mania.    Patient has not gotten his lithium levels drawn.  Provider encouraged patient to have his levels redrawn which he was agreeable. Today he is agreeable to increasing trazodone 200 mg to 250 mg as needed.  No other medication changes made today.  He is agreeable to continue all other medications as prescribed. No other concerns noted at this time.  Visit Diagnosis:    ICD-10-CM   1. Bipolar I disorder (HCC)  F31.9 traZODone (DESYREL) 100 MG tablet    risperiDONE (RISPERDAL) 3 MG tablet    FLUoxetine (PROZAC) 40 MG capsule    lithium carbonate 300 MG capsule    Lithium level    CBC w/Diff/Platelet    Hepatic function panel    Thyroid Panel With TSH    2. Insomnia due to mental condition  F51.05 traZODone (DESYREL) 100 MG tablet      Past Psychiatric History: bipolar disorder  Past Medical History:  Past Medical History:  Diagnosis Date   Bipolar 1 disorder (HCC)     Past Surgical History:  Procedure Laterality Date   HIATAL HERNIA REPAIR      Family Psychiatric History: Denies  Family History: No family history on file.  Social History:  Social History   Socioeconomic History   Marital status: Single    Spouse name: Not on file   Number of  children: Not on file   Years of education: Not on file   Highest education level: Not on file  Occupational History   Not on file  Tobacco Use   Smoking status: Former   Smokeless tobacco: Never  Substance and Sexual Activity   Alcohol use: Yes    Comment: occasional   Drug use: No   Sexual activity: Not on file  Other Topics Concern   Not on file  Social History Narrative   Not on file   Social Determinants of Health   Financial Resource Strain: Not on file  Food Insecurity: Not on file  Transportation Needs: Not on file  Physical Activity: Not on file  Stress: Not  on file  Social Connections: Not on file    Allergies:  Allergies  Allergen Reactions   Aspirin     Metabolic Disorder Labs: No results found for: HGBA1C, MPG No results found for: PROLACTIN No results found for: CHOL, TRIG, HDL, CHOLHDL, VLDL, LDLCALC Lab Results  Component Value Date   TSH 0.661 05/23/2018    Therapeutic Level Labs: Lab Results  Component Value Date   LITHIUM 0.17 (L) 05/23/2018   No results found for: VALPROATE No components found for:  CBMZ  Current Medications: Current Outpatient Medications  Medication Sig Dispense Refill   albuterol (PROVENTIL HFA;VENTOLIN HFA) 108 (90 Base) MCG/ACT inhaler Inhale 2 puffs into the lungs every 6 (six) hours as needed for wheezing or shortness of breath. 1 Inhaler 0   cyclobenzaprine (FLEXERIL) 10 MG tablet Take 1 tablet (10 mg total) by mouth 2 (two) times daily as needed for muscle spasms. 20 tablet 0   FLUoxetine (PROZAC) 40 MG capsule Take 1 capsule (40 mg total) by mouth daily. 30 capsule 3   lidocaine (LIDODERM) 5 % Place 1 patch onto the skin daily. Remove & Discard patch within 12 hours or as directed by MD 15 patch 0   lithium carbonate 300 MG capsule TAKE 2 CAPSULES BY MOUTH TWICE A DAY WITH MEALS FOR MOOD CONTROL 120 capsule 3   methocarbamol (ROBAXIN) 500 MG tablet Take 1 tablet (500 mg total) by mouth every 12 (twelve) hours as needed for muscle spasms. 12 tablet 0   omeprazole (PRILOSEC OTC) 20 MG tablet Take 20 mg by mouth 2 (two) times daily.     risperiDONE (RISPERDAL) 3 MG tablet Take 2 tablets (6 mg total) by mouth at bedtime. 60 tablet 3   traZODone (DESYREL) 100 MG tablet Take 2.5 tablets (250 mg total) by mouth at bedtime as needed. for sleep 75 tablet 3   No current facility-administered medications for this visit.     Musculoskeletal: Strength & Muscle Tone:  Unable to assess due to telephone visit Gait & Station:  Unable to assess due to telephone visit Patient leans: N/A  Psychiatric  Specialty Exam: Review of Systems  There were no vitals taken for this visit.There is no height or weight on file to calculate BMI.  General Appearance:  Unable to assess due to telephone visit  Eye Contact:   Unable to assess due to telephone visit  Speech:  Clear and Coherent and Normal Rate  Volume:  Normal  Mood:  Euthymic  Affect:  Appropriate and Congruent  Thought Process:  Coherent, Goal Directed and Linear  Orientation:  Full (Time, Place, and Person)  Thought Content: WDL and Logical   Suicidal Thoughts:  No  Homicidal Thoughts:  No  Memory:  Immediate;   Good Recent;   Good  Remote;   Good  Judgement:  Good  Insight:  Good  Psychomotor Activity:   Unable to assess due to telephone visit  Concentration:  Concentration: Good and Attention Span: Good  Recall:  Good  Fund of Knowledge: Good  Language: Good  Akathisia:  No  Handed:  Right  AIMS (if indicated): Not done, telephone visit  Assets:  Communication Skills Desire for Improvement Financial Resources/Insurance Housing Social Support  ADL's:  Intact  Cognition: WNL  Sleep:  Fair   Screenings: GAD-7    Flowsheet Row Video Visit from 11/21/2021 in [facility] Video Visit from 05/30/2021 in [facility] Video Visit from 10/07/2020 in [facility]  Total GAD-7 Score 11 1 8       PHQ2-9    Flowsheet Row Video Visit from 11/21/2021 in [facility] Video Visit from 05/30/2021 in [facility] Video Visit from 10/07/2020 in [facility] Office Visit from 11/03/2017 in Primary Care at [facility] Total Score 2 1 4 1   PHQ-9 Total Score 8 4 12  --      Flowsheet Row ED from 04/07/2021 in [facility]  C-SSRS RISK CATEGORY No Risk        Assessment and Plan: Patient notes that his anxiety and depression is well managed  however notes tha his sleep fluctuates between 3 and 7 hours.  Today he is agreeable to increasing trazodone 200 mg to 250 mg to help manage anxiety, depression, and sleep.  Provider discussed risk of serotonin syndrome.  He currently denies this symptoms of serotonin syndrome.  He will continue all other medications as prescribed.  Provider also encouraged patient to have his lithium levels redrawn.  Thyroid panel, hepatic function, and CBC labs also ordered.    1. Bipolar I disorder (HCC)  Increased- traZODone (DESYREL) 100 MG tablet; Take 2.5 tablets (250 mg total) by mouth at bedtime as needed. for sleep  Dispense: 75 tablet; Refill: 3 Continue- risperiDONE (RISPERDAL) 3 MG tablet; Take 2 tablets (6 mg total) by mouth at bedtime.  Dispense: 60 tablet; Refill: 3 Continue- FLUoxetine (PROZAC) 40 MG capsule; Take 1 capsule (40 mg total) by mouth daily.  Dispense: 30 capsule; Refill: 3 Continue- lithium carbonate 300 MG capsule; TAKE 2 CAPSULES BY MOUTH TWICE A DAY WITH MEALS FOR MOOD CONTROL  Dispense: 120 capsule; Refill: 3 - Lithium level - CBC w/Diff/Platelet - Hepatic function panel - Thyroid Panel With TSH  2. Insomnia due to mental condition  Increased- traZODone (DESYREL) 100 MG tablet; Take 2.5 tablets (250 mg total) by mouth at bedtime as needed. for sleep  Dispense: 75 tablet; Refill: 3   Follow-up in 3 months Follow-up with therapy  , NP 11/21/2021, 11:16 AM

## 2022-02-06 ENCOUNTER — Telehealth (HOSPITAL_COMMUNITY): Payer: No Payment, Other | Admitting: Psychiatry

## 2022-02-09 ENCOUNTER — Ambulatory Visit (INDEPENDENT_AMBULATORY_CARE_PROVIDER_SITE_OTHER): Payer: No Payment, Other | Admitting: Psychiatry

## 2022-02-09 DIAGNOSIS — F319 Bipolar disorder, unspecified: Secondary | ICD-10-CM

## 2022-02-09 DIAGNOSIS — F5105 Insomnia due to other mental disorder: Secondary | ICD-10-CM

## 2022-02-09 MED ORDER — TRAZODONE HCL 100 MG PO TABS: 250.0000 mg | ORAL_TABLET | Freq: Every evening | ORAL | 0 refills | Status: DC | PRN

## 2022-02-09 MED ORDER — FLUOXETINE HCL 40 MG PO CAPS: 40.0000 mg | ORAL_CAPSULE | Freq: Every day | ORAL | 0 refills | Status: DC

## 2022-02-09 MED ORDER — LITHIUM CARBONATE 300 MG PO CAPS: ORAL_CAPSULE | ORAL | 0 refills | Status: DC

## 2022-02-09 MED ORDER — RISPERIDONE 3 MG PO TABS: 6.0000 mg | ORAL_TABLET | Freq: Every day | ORAL | 0 refills | Status: DC

## 2022-02-09 NOTE — Progress Notes (Signed)
BH MD/PA/NP OP Progress Note ? ?02/09/2022 3:30 PM ?Charles Ross  ?MRN:  [id number] ? ?Virtual Visit via Telephone Note ? ?I connected with Charles Ross on 02/09/22 at  3:30 PM EDT by telephone and verified that I am speaking with the correct person using two identifiers. ? ?Location: ?Patient: home ?Provider: offsite ?  ?I discussed the limitations, risks, security and privacy concerns of performing an evaluation and management service by telephone and the availability of in person appointments. I also discussed with the patient that there may be a patient responsible charge related to this service. The patient expressed understanding and agreed to proceed. ? ? ?  ?I discussed the assessment and treatment plan with the patient. The patient was provided an opportunity to ask questions and all were answered. The patient agreed with the plan and demonstrated an understanding of the instructions. ?  ?The patient was advised to call back or seek an in-person evaluation if the symptoms worsen or if the condition fails to improve as anticipated. ? ?I provided 10 minutes of non-face-to-face time during this encounter. ? ? ?Mcneil Sober, NP  ? ?Chief Complaint:Medication management ? ?HPI: Charles Ross is a 51 year old male presenting to [facility] behavioral health outpatient for follow-up psychiatric evaluation.  Patient has a psychiatric history of bipolar disorder and insomnia.  His symptoms are managed with Prozac 40 mg daily, lithium 600 mg twice daily, Risperdal 6 mg daily at bedtime and trazodone 250 mg at bedtime as needed for sleep.  Patient reports that his medications are effective with managing his symptoms and that he is medication compliant.  Patient denies adverse medication reactions or the need for dosage adjustment today.  No medication changes today. ? ? ?Visit Diagnosis:  ?  ICD-10-CM   ?1. Bipolar I disorder (HCC)  F31.9 FLUoxetine (PROZAC) 40 MG capsule  ?  lithium carbonate 300 MG capsule  ?   risperiDONE (RISPERDAL) 3 MG tablet  ?  traZODone (DESYREL) 100 MG tablet  ?  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  ? ? ?Past Psychiatric History: Bipolar disorder and insomnia ? ?Past Medical History:  ?Past Medical History:  ?Diagnosis Date  ? Bipolar 1 disorder (HCC)   ?  ?Past Surgical History:  ?Procedure Laterality Date  ? HIATAL HERNIA REPAIR    ? ? ?Family Psychiatric History: N/A ? ?Family History: No family history on file. ? ?Social History:  ?Social History  ? ?Socioeconomic History  ? Marital status: Single  ?  Spouse name: Not on file  ? Number of children: Not on file  ? Years of education: Not on file  ? Highest education level: Not on file  ?Occupational History  ? Not on file  ?Tobacco Use  ? Smoking status: Former  ? Smokeless tobacco: Never  ?Substance and Sexual Activity  ? Alcohol use: Yes  ?  Comment: occasional  ? Drug use: No  ? Sexual activity: Not on file  ?Other Topics Concern  ? Not on file  ?Social History Narrative  ? Not on file  ? ?Social Determinants of Health  ? ?Financial Resource Strain: Not on file  ?Food Insecurity: Not on file  ?Transportation Needs: Not on file  ?Physical Activity: Not on file  ?Stress: Not on file  ?Social Connections: Not on file  ? ? ?Allergies:  ?Allergies  ?Allergen Reactions  ? Aspirin   ? ? ?Metabolic Disorder Labs: ?No results found for: HGBA1C,  MPG ?No results found for: PROLACTIN ?No results found for: CHOL, TRIG, HDL, CHOLHDL, VLDL, LDLCALC ?Lab Results  ?Component Value Date  ? TSH 0.661 05/23/2018  ? ? ?Therapeutic Level Labs: ?Lab Results  ?Component Value Date  ? LITHIUM 0.17 (L) 05/23/2018  ? ?No results found for: VALPROATE ?No components found for:  CBMZ ? ?Current Medications: ?Current Outpatient Medications  ?Medication Sig Dispense Refill  ? albuterol (PROVENTIL HFA;VENTOLIN HFA) 108 (90 Base) MCG/ACT inhaler Inhale 2 puffs into the lungs every 6 (six) hours as needed for wheezing or shortness  of breath. 1 Inhaler 0  ? cyclobenzaprine (FLEXERIL) 10 MG tablet Take 1 tablet (10 mg total) by mouth 2 (two) times daily as needed for muscle spasms. 20 tablet 0  ? FLUoxetine (PROZAC) 40 MG capsule Take 1 capsule (40 mg total) by mouth daily. 30 capsule 0  ? lidocaine (LIDODERM) 5 % Place 1 patch onto the skin daily. Remove & Discard patch within 12 hours or as directed by MD 15 patch 0  ? lithium carbonate 300 MG capsule TAKE 2 CAPSULES BY MOUTH TWICE A DAY WITH MEALS FOR MOOD CONTROL 120 capsule 0  ? methocarbamol (ROBAXIN) 500 MG tablet Take 1 tablet (500 mg total) by mouth every 12 (twelve) hours as needed for muscle spasms. 12 tablet 0  ? omeprazole (PRILOSEC OTC) 20 MG tablet Take 20 mg by mouth 2 (two) times daily.    ? risperiDONE (RISPERDAL) 3 MG tablet Take 2 tablets (6 mg total) by mouth at bedtime. 60 tablet 0  ? traZODone (DESYREL) 100 MG tablet Take 2.5 tablets (250 mg total) by mouth at bedtime as needed. for sleep 75 tablet 0  ? ?No current facility-administered medications for this visit.  ? ? ? ?Musculoskeletal: ?Strength & Muscle Tone: n/a virtual visit ?Gait & Station: n/a ?Patient leans: n/a ? ?Psychiatric Specialty Exam: ?Review of Systems  ?Psychiatric/Behavioral:  Negative for hallucinations, self-injury and suicidal ideas.   ?All other systems reviewed and are negative.  ?There were no vitals taken for this visit.There is no height or weight on file to calculate BMI.  ?General Appearance: n/a  ?Eye Contact:  n/a  ?Speech:  Clear and Coherent  ?Volume:  Normal  ?Mood:  Euthymic  ?Affect: n/a  ?Thought Process:  Coherent  ?Orientation:  Full (Time, Place, and Person)  ?Thought Content: Logical   ?Suicidal Thoughts:  No  ?Homicidal Thoughts:  No  ?Memory:  good  ?Judgement:  good  ?Insight:  good  ?Psychomotor Activity:  n/a  ?Concentration:  good  ?Recall:  good  ?Fund of Knowledge: good  ?Language: good  ?Akathisia:  n/a  ?Handed:  right  ?AIMS (if indicated): not done  ?Assets:   Communication Skills  ?ADL's:  Intact  ?Cognition: WNL  ?Sleep:  Good  ? ?Screenings: ?GAD-7   ? ?Flowsheet Row [facility]  ?Total GAD-7 Score 11 1 8   ? ?  ? ?PHQ2-9   ? ?Flowsheet Row [facility]2017 [place] [facility] [place]  ?PHQ-2 Total Score 2 1 4 1   ?PHQ-9 Total Score 8 4 12  --  ? ?  ? ?Flowsheet Row ED  from 04/07/2021 in [facility]  ?C-SSRS RISK CATEGORY No Risk  ? ?  ? ? ? ?Assessment and Plan:  Markian Glockner is a 51 year old male presenting to [facility] behavioral health outpatient for follow-up psychiatric evaluation.  Patient is being seen by this provider due to his attending psychiatric provider being unavailable to complete today's appointment.  Patient has a psychiatric history of bipolar disorder and insomnia.  His symptoms are managed with Prozac 40 mg daily, lithium 600 mg twice daily, Risperdal 6 mg daily at bedtime and trazodone 250 mg at bedtime as needed for sleep.  Patient reports that his medications are effective with managing his symptoms and that he is medication compliant.  Patient denies adverse medication reactions or the need for dosage adjustment today.  No medication changes today.  Current medications refilled at previous dosages. ? ?Collaboration of Care: Collaboration of Care: Medication Management AEB medications E scribed to patient's preferred pharmac to bridge patient until he is able to resume care with his attending psychiatric provider y. ? ?1. Bipolar I disorder (HCC) ? ?- FLUoxetine (PROZAC) 40 MG capsule; Take 1  capsule (40 mg total) by mouth daily.  Dispense: 30 capsule; Refill: 0 ?- lithium carbonate 300 MG capsule; TAKE 2 CAPSULES BY MOUTH TWICE A DAY WITH MEALS FOR MOOD CONTROL  Dispense: 120 capsule; Refill: 0 ?- risp

## 2022-05-12 ENCOUNTER — Encounter (HOSPITAL_COMMUNITY): Payer: Self-pay

## 2022-05-12 ENCOUNTER — Telehealth (HOSPITAL_COMMUNITY): Payer: No Payment, Other | Admitting: Psychiatry

## 2022-05-19 ENCOUNTER — Other Ambulatory Visit (HOSPITAL_COMMUNITY): Payer: Self-pay | Admitting: Psychiatry

## 2022-05-19 ENCOUNTER — Telehealth (HOSPITAL_COMMUNITY): Payer: Self-pay | Admitting: *Deleted

## 2022-05-19 DIAGNOSIS — F5105 Insomnia due to other mental disorder: Secondary | ICD-10-CM

## 2022-05-19 DIAGNOSIS — F319 Bipolar disorder, unspecified: Secondary | ICD-10-CM

## 2022-05-19 MED ORDER — RISPERIDONE 3 MG PO TABS: 6.0000 mg | ORAL_TABLET | Freq: Every day | ORAL | 3 refills | Status: DC

## 2022-05-19 MED ORDER — FLUOXETINE HCL 40 MG PO CAPS: 40.0000 mg | ORAL_CAPSULE | Freq: Every day | ORAL | 3 refills | Status: DC

## 2022-05-19 MED ORDER — LITHIUM CARBONATE 300 MG PO CAPS: ORAL_CAPSULE | ORAL | 3 refills | Status: DC

## 2022-05-19 MED ORDER — TRAZODONE HCL 100 MG PO TABS: 250.0000 mg | ORAL_TABLET | Freq: Every evening | ORAL | 3 refills | Status: DC | PRN

## 2022-05-19 NOTE — Telephone Encounter (Signed)
All medication refilled and sent to preferred pharmacy.  Patient will need to reschedule an appointment for further refills.

## 2022-05-19 NOTE — Telephone Encounter (Signed)
RX REFILL REQUEST traZODone (DESYREL) 100 MG tablet

## 2022-08-19 ENCOUNTER — Other Ambulatory Visit (HOSPITAL_COMMUNITY): Payer: Self-pay | Admitting: Psychiatry

## 2022-08-19 DIAGNOSIS — F319 Bipolar disorder, unspecified: Secondary | ICD-10-CM

## 2022-08-19 DIAGNOSIS — F5105 Insomnia due to other mental disorder: Secondary | ICD-10-CM

## 2022-11-25 ENCOUNTER — Other Ambulatory Visit (HOSPITAL_COMMUNITY): Payer: Self-pay | Admitting: Psychiatry

## 2022-11-25 DIAGNOSIS — F319 Bipolar disorder, unspecified: Secondary | ICD-10-CM

## 2022-12-10 ENCOUNTER — Ambulatory Visit (INDEPENDENT_AMBULATORY_CARE_PROVIDER_SITE_OTHER): Payer: No Payment, Other | Admitting: Psychiatry

## 2022-12-10 ENCOUNTER — Encounter (HOSPITAL_COMMUNITY): Payer: Self-pay | Admitting: Psychiatry

## 2022-12-10 DIAGNOSIS — F5105 Insomnia due to other mental disorder: Secondary | ICD-10-CM | POA: Diagnosis not present

## 2022-12-10 DIAGNOSIS — F319 Bipolar disorder, unspecified: Secondary | ICD-10-CM | POA: Diagnosis not present

## 2022-12-10 MED ORDER — LITHIUM CARBONATE 300 MG PO CAPS: ORAL_CAPSULE | ORAL | 3 refills | Status: DC

## 2022-12-10 MED ORDER — FLUOXETINE HCL 40 MG PO CAPS
40.0000 mg | ORAL_CAPSULE | Freq: Every day | ORAL | 3 refills | Status: DC
Start: 2022-12-10 — End: 2023-05-05

## 2022-12-10 MED ORDER — TRAZODONE HCL 100 MG PO TABS: 100.0000 mg | ORAL_TABLET | Freq: Every evening | ORAL | 3 refills | Status: DC | PRN

## 2022-12-10 MED ORDER — RISPERIDONE 3 MG PO TABS: 6.0000 mg | ORAL_TABLET | Freq: Every day | ORAL | 3 refills | Status: DC

## 2022-12-10 NOTE — Progress Notes (Signed)
BH MD/PA/NP OP Progress Note  12/10/2022 3:05 PM Charles Ross [facility]  MRN:  [id number]  Chief Complaint: " I ran out of lithium" Chief Complaint   Injections     HPI: 52 year old male seen today for follow up psychiatric evaluation.  Patient has not been seen in the clinic for over a year.  He however reports that his medications were continuously refilled but recently he ran out.   He is currently being managed on lithium 600 mg twice daily, Prozac 40 mg daily, trazodone 100 mg nightly, Risperdal 6 mg at bedtime.  Provider called Center to confirm that patients last refills and was told that her last received refills in February.  Today he notes that his medications are effective in managing his psychiatric conditions.    Today he is well-groomed, pleasant, cooperative, and engaged in conversation.  He informed Probation officer that he feels mentally stable but would like to restart his lithium as he ran out.  Patient notes that he has minimal anxiety and depression.  Today provider conducted GAD-7 and patient scored a 4.  Provider also conducted PHQ-9 he scored a 4.  He endorsed adequate sleep and appetite.  Today he denies SI/HI/VH, mania, paranoia.    No medication changes made today.  Patient agreeable to continue medications as prescribed.  No other concerns noted at this time.    Visit Diagnosis:    ICD-10-CM   1. 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 No pertinent family  history.  Social History:  Social History   Socioeconomic History   Marital status: Single    Spouse name: Not on file   Number of children: Not on file   Years of education: Not on file   Highest education level: Not on file  Occupational History   Not on file  Tobacco Use   Smoking status: Former   Smokeless tobacco: Never  Substance and Sexual Activity   Alcohol use: Yes    Comment: occasional   Drug use: No   Sexual activity: Not on file  Other Topics Concern   Not on file  Social History Narrative   Not on file   Social Determinants of Health   Financial Resource Strain: Not on file  Food Insecurity: Not on file  Transportation Needs: Not on file  Physical Activity: Not on file  Stress: Not on file  Social Connections: Not on file    Allergies:  Allergies  Allergen Reactions   Aspirin     Metabolic Disorder Labs: No results found for: "HGBA1C", "MPG" No results found for: "PROLACTIN" No results found for: "CHOL", "TRIG", "HDL", "CHOLHDL", "VLDL", "LDLCALC" Lab Results  Component Value Date   TSH 0.661 05/23/2018    Therapeutic Level Labs: Lab Results  Component Value Date   LITHIUM 0.17 (L) 05/23/2018   No results found for: "VALPROATE" No results found for: "CBMZ"  Current Medications: Current Outpatient  Medications  Medication Sig Dispense Refill   albuterol (PROVENTIL HFA;VENTOLIN HFA) 108 (90 Base) MCG/ACT inhaler Inhale 2 puffs into the lungs every 6 (six) hours as needed for wheezing or shortness of breath. 1 Inhaler 0   cyclobenzaprine (FLEXERIL) 10 MG tablet Take 1 tablet (10 mg total) by mouth 2 (two) times daily as needed for muscle spasms. 20 tablet 0   FLUoxetine (PROZAC) 40 MG capsule Take 1 capsule (40 mg total) by mouth daily. 30 capsule 3   lidocaine (LIDODERM) 5 % Place 1 patch onto the skin daily. Remove & Discard patch within 12 hours or as directed by MD 15 patch 0   lithium carbonate 300 MG capsule TAKE 2 CAPSULES BY MOUTH  TWICE A DAY WITH MEALS FOR MOOD CONTROL 120 capsule 3   methocarbamol (ROBAXIN) 500 MG tablet Take 1 tablet (500 mg total) by mouth every 12 (twelve) hours as needed for muscle spasms. 12 tablet 0   omeprazole (PRILOSEC OTC) 20 MG tablet Take 20 mg by mouth 2 (two) times daily.     risperiDONE (RISPERDAL) 3 MG tablet Take 2 tablets (6 mg total) by mouth at bedtime. 60 tablet 3   traZODone (DESYREL) 100 MG tablet Take 1 tablet (100 mg total) by mouth at bedtime as needed for sleep. 30 tablet 3   No current facility-administered medications for this visit.     Musculoskeletal: Strength & Muscle Tone: within normal limits Gait & Station: normal Patient leans: N/A  Psychiatric Specialty Exam: Review of Systems  Blood pressure 131/85, pulse 97, height '6\' 1"'$  (1.854 m), weight 185 lb (83.9 kg), SpO2 97 %.Body mass index is 24.41 kg/m.  General Appearance: Well Groomed  Eye Contact:  Good  Speech:  Clear and Coherent and Normal Rate  Volume:  Normal  Mood:  Euthymic  Affect:  Appropriate and Congruent  Thought Process:  Coherent, Goal Directed and Linear  Orientation:  Full (Time, Place, and Person)  Thought Content: WDL and Logical   Suicidal Thoughts:  No  Homicidal Thoughts:  No  Memory:  Immediate;   Good Recent;   Good Remote;   Good  Judgement:  Good  Insight:  Good  Psychomotor Activity:  Normal  Concentration:  Concentration: Good and Attention Span: Good  Recall:  Good  Fund of Knowledge: Good  Language: Good  Akathisia:  No  Handed:  Right  AIMS (if indicated): Not done,  Assets:  Communication Skills Desire for Improvement Financial Resources/Insurance Housing Social Support  ADL's:  Intact  Cognition: WNL  Sleep:  Good   Screenings: GAD-7    Flowsheet Row Clinical Support from 12/10/2022 in [facility] Video Visit from 11/21/2021 in [facility] Video Visit from 05/30/2021 in [facility] Video Visit from 10/07/2020 in [facility]  Total GAD-7 Score '4 11 1 8      '$ PHQ2-9    Flowsheet Row Clinical Support from 12/10/2022 in [facility] Video Visit from 11/21/2021 [facility] Video Visit from 05/30/2021 in [facility] Video Visit from 10/07/2020 in [facility] Office Visit from 11/03/2017 in [facility] at [facility] Total Score '2 2 1 4 1  '$ PHQ-9 Total Score '4 8 4 12 '$ --      [place] ED from 04/07/2021 in [facility] Emergency Department at [place] No Risk  Assessment and Plan: Patient notes that he is doing well on his current medication regimen but is in need of refills.  No medication changes made today.  Patient agreeable to continue medications as prescribed.  1. Bipolar I disorder (HCC)  Continue- FLUoxetine (PROZAC) 40 MG capsule; Take 1 capsule (40 mg total) by mouth daily.  Dispense: 30 capsule; Refill: 3 Continue- lithium carbonate 300 MG capsule; TAKE 2 CAPSULES BY MOUTH TWICE A DAY WITH MEALS FOR MOOD CONTROL  Dispense: 120 capsule; Refill: 3 Continue- risperiDONE (RISPERDAL) 3 MG tablet; Take 2 tablets (6 mg total) by mouth at bedtime.  Dispense: 60 tablet; Refill: 3 Continue- traZODone (DESYREL) 100 MG tablet; Take 1 tablet (100 mg total) by mouth at bedtime as needed for sleep.  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  Dispense: 30 tablet; Refill: 3   Follow-up in 3 months Follow-up with therapy  Salley Slaughter, NP 12/10/2022, 3:05 PM

## 2022-12-24 ENCOUNTER — Telehealth (HOSPITAL_COMMUNITY): Payer: Self-pay | Admitting: Psychiatry

## 2022-12-28 NOTE — Telephone Encounter (Signed)
Provider attempted to call the patient once today and four times last week to discuss medication adjustment but he did not pick up. Provider left a message to call the clinic with further questions or concerns. Medications not adjusted at this time.

## 2023-02-25 ENCOUNTER — Encounter (HOSPITAL_COMMUNITY): Payer: No Payment, Other | Admitting: Psychiatry

## 2023-05-05 ENCOUNTER — Other Ambulatory Visit (HOSPITAL_COMMUNITY): Payer: Self-pay | Admitting: Psychiatry

## 2023-05-05 ENCOUNTER — Telehealth (HOSPITAL_COMMUNITY): Payer: Self-pay | Admitting: Psychiatry

## 2023-05-05 DIAGNOSIS — F319 Bipolar disorder, unspecified: Secondary | ICD-10-CM

## 2023-05-05 DIAGNOSIS — F5105 Insomnia due to other mental disorder: Secondary | ICD-10-CM

## 2023-05-05 MED ORDER — FLUOXETINE HCL 40 MG PO CAPS
40.0000 mg | ORAL_CAPSULE | Freq: Every day | ORAL | 3 refills | Status: DC
Start: 2023-05-05 — End: 2023-05-05

## 2023-05-05 MED ORDER — LITHIUM CARBONATE 300 MG PO CAPS
ORAL_CAPSULE | ORAL | 3 refills | Status: DC
Start: 2023-05-05 — End: 2023-05-13

## 2023-05-05 MED ORDER — FLUOXETINE HCL 40 MG PO CAPS
40.0000 mg | ORAL_CAPSULE | Freq: Every day | ORAL | 3 refills | Status: DC
Start: 2023-05-05 — End: 2023-05-13

## 2023-05-05 MED ORDER — TRAZODONE HCL 100 MG PO TABS: 100.0000 mg | ORAL_TABLET | Freq: Every evening | ORAL | 3 refills | Status: DC | PRN

## 2023-05-05 MED ORDER — RISPERIDONE 3 MG PO TABS
6.0000 mg | ORAL_TABLET | Freq: Every day | ORAL | 3 refills | Status: DC
Start: 2023-05-05 — End: 2023-05-13

## 2023-05-05 MED ORDER — TRAZODONE HCL 100 MG PO TABS
100.0000 mg | ORAL_TABLET | Freq: Every evening | ORAL | 3 refills | Status: DC | PRN
Start: 2023-05-05 — End: 2023-05-13

## 2023-05-05 MED ORDER — RISPERIDONE 3 MG PO TABS
6.0000 mg | ORAL_TABLET | Freq: Every day | ORAL | 3 refills | Status: DC
Start: 2023-05-05 — End: 2023-05-05

## 2023-05-05 MED ORDER — LITHIUM CARBONATE 300 MG PO CAPS
ORAL_CAPSULE | ORAL | 3 refills | Status: DC
Start: 2023-05-05 — End: 2023-05-05

## 2023-05-05 NOTE — Telephone Encounter (Signed)
Provider spoke to patient.  Provider sent medication to [facility].  Provide also encourage patient to have labs drawn.  He was agreeable to this.

## 2023-05-13 ENCOUNTER — Encounter (HOSPITAL_COMMUNITY): Payer: Self-pay | Admitting: Psychiatry

## 2023-05-13 ENCOUNTER — Encounter (HOSPITAL_COMMUNITY): Payer: No Payment, Other | Admitting: Psychiatry

## 2023-05-13 ENCOUNTER — Ambulatory Visit (INDEPENDENT_AMBULATORY_CARE_PROVIDER_SITE_OTHER): Payer: No Payment, Other | Admitting: Psychiatry

## 2023-05-13 DIAGNOSIS — F319 Bipolar disorder, unspecified: Secondary | ICD-10-CM

## 2023-05-13 DIAGNOSIS — F5105 Insomnia due to other mental disorder: Secondary | ICD-10-CM

## 2023-05-13 MED ORDER — TRAZODONE HCL 100 MG PO TABS
100.0000 mg | ORAL_TABLET | Freq: Every evening | ORAL | 3 refills | Status: DC | PRN
Start: 2023-05-13 — End: 2023-08-10

## 2023-05-13 MED ORDER — FLUOXETINE HCL 40 MG PO CAPS
40.0000 mg | ORAL_CAPSULE | Freq: Every day | ORAL | 3 refills | Status: DC
Start: 2023-05-13 — End: 2023-08-10

## 2023-05-13 MED ORDER — LITHIUM CARBONATE 300 MG PO CAPS
ORAL_CAPSULE | ORAL | 3 refills | Status: DC
Start: 2023-05-13 — End: 2023-08-10

## 2023-05-13 MED ORDER — RISPERIDONE 3 MG PO TABS
6.0000 mg | ORAL_TABLET | Freq: Every day | ORAL | 3 refills | Status: DC
Start: 2023-05-13 — End: 2023-08-10

## 2023-05-13 NOTE — Progress Notes (Signed)
BH MD/PA/NP OP Progress Note  05/13/2023 1:37 PM Charles Ross  MRN:  [id number]  Chief Complaint: " I had my labs drawn this morning"    HPI: 52 year old male seen today for follow up psychiatric evaluation.  He has a psychiatric history of bipolar 1 disorder and insomnia.  He is currently being managed on lithium 600 mg twice daily, Prozac 40 mg daily, trazodone 100 mg nightly, Risperdal 6 mg at bedtime.  Today he notes that his medications are effective in managing his psychiatric conditions.    Today he is well-groomed, pleasant, cooperative, and engaged in conversation.  He informed Clinical research associate that he recently got his labs drawn this morning.  Provider does not have patient's current labs and informed her that they would be discussed with him once she reviewed them.  He endorsed understanding and agreed.  Patient notes that his mood is stable and informed writer that his anxiety and depression are well-managed.   Today provider conducted GAD-7 and patient scored a 1, at his last visit he scored a 4.  Provider also conducted PHQ-9 he scored a 0, at his last visit he scored a 4.  He endorsed adequate sleep and appetite.  Today he denies SI/HI/VH, mania, paranoia.    During exam patient licked his tongue out abnormally.  Provider conducted an aims assessment patient scored a 1.  Patient informed Clinical research associate that he has been doing this for years.  When provider asked patient to control the movement he was able to.  No medication changes made today.  Patient agreeable to continue medications as prescribed.  No other concerns noted at this time.    Visit Diagnosis:    ICD-10-CM   1. 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  Social History:  Social History   Socioeconomic History   Marital status: Single    Spouse name: Not on file   Number of children: Not on file   Years of education: Not on file   Highest education level: Not on file  Occupational History   Not on file  Tobacco Use   Smoking status: Former   Smokeless tobacco: Never  Substance and Sexual Activity   Alcohol use: Yes    Comment: occasional   Drug use: No   Sexual activity: Not on file  Other Topics Concern   Not on file  Social History Narrative   Not on file   Social Determinants of Health   Financial Resource Strain: Not on file  Food Insecurity: Not on file  Transportation Needs: Not on file  Physical Activity: Not on file  Stress: Not on file  Social Connections: Not on file    Allergies:  Allergies  Allergen Reactions   Aspirin     Metabolic Disorder Labs: No results found for: "HGBA1C", "MPG" No results found for: "PROLACTIN" No results found for: "CHOL", "TRIG", "HDL", "CHOLHDL", "  VLDL", "LDLCALC" Lab Results  Component Value Date   TSH 0.661 05/23/2018    Therapeutic Level Labs: Lab Results  Component Value Date   LITHIUM 0.17 (L) 05/23/2018   No results found for: "VALPROATE" No results found for: "CBMZ"  Current Medications: Current Outpatient Medications  Medication Sig Dispense Refill   albuterol (PROVENTIL HFA;VENTOLIN HFA) 108 (90 Base) MCG/ACT inhaler Inhale 2 puffs into the lungs every 6 (six) hours as needed for wheezing or shortness of breath. 1 Inhaler 0   cyclobenzaprine (FLEXERIL) 10 MG tablet Take 1 tablet (10 mg total) by mouth 2 (two) times daily as needed for muscle spasms. 20 tablet 0   FLUoxetine (PROZAC) 40 MG capsule Take 1  capsule (40 mg total) by mouth daily. 30 capsule 3   lidocaine (LIDODERM) 5 % Place 1 patch onto the skin daily. Remove & Discard patch within 12 hours or as directed by MD 15 patch 0   lithium carbonate 300 MG capsule TAKE 2 CAPSULES BY MOUTH TWICE A DAY WITH MEALS FOR MOOD CONTROL 120 capsule 3   methocarbamol (ROBAXIN) 500 MG tablet Take 1 tablet (500 mg total) by mouth every 12 (twelve) hours as needed for muscle spasms. 12 tablet 0   omeprazole (PRILOSEC OTC) 20 MG tablet Take 20 mg by mouth 2 (two) times daily.     risperiDONE (RISPERDAL) 3 MG tablet Take 2 tablets (6 mg total) by mouth at bedtime. 60 tablet 3   traZODone (DESYREL) 100 MG tablet Take 1 tablet (100 mg total) by mouth at bedtime as needed for sleep. 30 tablet 3   No current facility-administered medications for this visit.     Musculoskeletal: Strength & Muscle Tone: within normal limits Gait & Station: normal Patient leans: N/A  Psychiatric Specialty Exam: Review of Systems  Blood pressure 125/77, pulse 82, temperature 98.5 F (36.9 C), weight 187 lb 12.8 oz (85.2 kg), SpO2 98%.Body mass index is 24.78 kg/m.  General Appearance: Well Groomed  Eye Contact:  Good  Speech:  Clear and Coherent and Normal Rate  Volume:  Normal  Mood:  Euthymic  Affect:  Appropriate and Congruent  Thought Process:  Coherent, Goal Directed and Linear  Orientation:  Full (Time, Place, and Person)  Thought Content: WDL and Logical   Suicidal Thoughts:  No  Homicidal Thoughts:  No  Memory:  Immediate;   Good Recent;   Good Remote;   Good  Judgement:  Good  Insight:  Good  Psychomotor Activity:  Normal  Concentration:  Concentration: Good and Attention Span: Good  Recall:  Good  Fund of Knowledge: Good  Language: Good  Akathisia:  No  Handed:  Right  AIMS (if indicated): done, 1  Assets:  Communication Skills Desire for Improvement Financial Resources/Insurance Housing Social Support  ADL's:  Intact  Cognition: WNL   Sleep:  Good   Screenings: AIMS    Flowsheet Row Clinical Support from 05/13/2023 in [facility]  AIMS Total Score 0      GAD-7    Flowsheet Row Clinical Support from 05/13/2023 in [facility] Clinical Support from 12/10/2022 in [facility] Video Visit from 11/21/2021 in [facility] Video Visit from 05/30/2021 in [facility] Video Visit from 10/07/2020 in [facility]  Total GAD-7 Score 1 4 11 1 8       PHQ2-9    Flowsheet Row Clinical Support from 05/13/2023 in [facility] Clinical Support from 12/10/2022 in [facility] Video Visit from 11/21/2021 in [facility] Video Visit from 05/30/2021 in [facility] Video Visit from 10/07/2020 in [facility]  PHQ-2 Total Score 0 2 2 1 4   PHQ-9 Total Score 0 4 8 4 12       Flowsheet Row ED from 04/07/2021 in [facility] Emergency Department at [facility]  C-SSRS RISK CATEGORY No Risk        Assessment and Plan: Patient notes that he is doing well on his current medication regimen but is in need of refills.  No medication changes made today.  Patient agreeable to continue medications as prescribed.  1. Bipolar I disorder (HCC)  Continue- FLUoxetine (PROZAC) 40 MG capsule; Take 1 capsule (40 mg total) by mouth daily.  Dispense: 30 capsule; Refill: 3 Continue- lithium carbonate 300 MG capsule; TAKE 2 CAPSULES BY MOUTH TWICE A DAY WITH MEALS FOR MOOD CONTROL  Dispense: 120 capsule; Refill: 3 Continue- risperiDONE (RISPERDAL) 3 MG tablet; Take 2 tablets (6 mg total) by mouth at bedtime.  Dispense: 60 tablet; Refill: 3 Continue- traZODone (DESYREL) 100 MG tablet; Take 1 tablet (100 mg total) by mouth at bedtime as needed for sleep.   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  Dispense: 30 tablet; Refill: 3   Follow-up in 3 months Follow-up with therapy  Shanna Cisco, NP 05/13/2023, 1:37 PM

## 2023-05-14 LAB — COMPREHENSIVE METABOLIC PANEL: Total Protein: 6.2 g/dL (ref 6.0–8.5)

## 2023-05-14 LAB — THYROID PANEL WITH TSH: Free Thyroxine Index: 1.4 (ref 1.2–4.9)

## 2023-05-18 NOTE — Progress Notes (Signed)
Provider attempted to call patient 4 times without success.  Provider will discuss patient lab results at his follow-up visit.

## 2023-08-10 ENCOUNTER — Ambulatory Visit (INDEPENDENT_AMBULATORY_CARE_PROVIDER_SITE_OTHER): Payer: No Payment, Other | Admitting: Psychiatry

## 2023-08-10 ENCOUNTER — Encounter (HOSPITAL_COMMUNITY): Payer: Self-pay | Admitting: Psychiatry

## 2023-08-10 VITALS — BP 115/79 | HR 75 | Temp 98.5°F | Ht 73.0 in | Wt 185.0 lb

## 2023-08-10 DIAGNOSIS — Z0000 Encounter for general adult medical examination without abnormal findings: Secondary | ICD-10-CM

## 2023-08-10 DIAGNOSIS — F319 Bipolar disorder, unspecified: Secondary | ICD-10-CM | POA: Diagnosis not present

## 2023-08-10 DIAGNOSIS — F5105 Insomnia due to other mental disorder: Secondary | ICD-10-CM | POA: Diagnosis not present

## 2023-08-10 MED ORDER — LITHIUM CARBONATE 300 MG PO CAPS: ORAL_CAPSULE | ORAL | 3 refills | Status: DC

## 2023-08-10 MED ORDER — FLUOXETINE HCL 40 MG PO CAPS: 40.0000 mg | ORAL_CAPSULE | Freq: Every day | ORAL | 3 refills | Status: DC

## 2023-08-10 MED ORDER — TRAZODONE HCL 150 MG PO TABS: 150.0000 mg | ORAL_TABLET | Freq: Every evening | ORAL | 3 refills | Status: DC | PRN

## 2023-08-10 MED ORDER — RISPERIDONE 3 MG PO TABS: 6.0000 mg | ORAL_TABLET | Freq: Every day | ORAL | 3 refills | Status: DC

## 2023-08-10 NOTE — Progress Notes (Signed)
BH MD/PA/NP OP Progress Note  08/10/2023 1:20 PM Charles Ross  MRN:  [id number]  Chief Complaint: " I have issues falling asleep"    HPI: 52 year old male seen today for follow up psychiatric evaluation.  He has a psychiatric history of bipolar 1 disorder and insomnia.  He is currently being managed on lithium 600 mg twice daily, Prozac 40 mg daily, trazodone 100 mg nightly, Risperdal 6 mg at bedtime.  Today he notes that his medications are effective in managing his psychiatric conditions.    Today he is well-groomed, pleasant, cooperative, and engaged in conversation.  He informed Clinical research associate that he has issues with sleep.  Patient notes that when he does sleep he is able to get at least 6 hours of rest.  Patient informed writer that he is is staying busy at work.  He continues to work at [employer] and find enjoyment in his job.   Since his last visit he informed writer that his anxiety and depression continues to be well-managed.  Provider conducted a GAD-7 and patient scored a 5, at his last visit he scored a 1. Provider also conducted PHQ-9 he scored a 4, at his last visit he scored a 0.  He endorsed  appetite.  Today he denies SI/HI/VAH, mania, paranoia.    During exam patient licked his tongue out abnormally.  Provider conducted an aims assessment patient scored a 1, at his last visit he scored a 1.  Patient reports that he has been on this for years and he denies other abnormal muscle movements.   Today trazodone 100 mg increased to 150 mg to help him sleep.  He will continue other medications as prescribed.  Provider discussed patient's elevated cholesterol and prolactin levels.  Patient referred to community health and wellness for primary care.  Provider informed patient that his prolactin level will be reassessed in a few months and informed him that if it is elevated he would be referred to endocrinology to rule out pituitary adenoma.  He endorsed understanding and agreed.  Provider  did inform patient that Risperdal can elevate his prolactin level.  He endorsed understanding and agreed.  No other concerns noted at this time.  Visit Diagnosis:    ICD-10-CM   1. Well adult health check  Z00.00 Ambulatory referral to Internal Medicine    2. 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  Social History:  Social History   Socioeconomic History   Marital status: Single    Spouse name: Not on file   Number of children: Not on file   Years of education: Not on file   Highest education level: Not on file  Occupational History   Not on file  Tobacco Use   Smoking status: Former   Smokeless tobacco: Never  Substance and Sexual Activity   Alcohol use: Yes    Comment: occasional  Drug use: No   Sexual activity: Not on file  Other Topics Concern   Not on file  Social History Narrative   Not on file   Social Determinants of Health   Financial Resource Strain: Not on file  Food Insecurity: Not on file  Transportation Needs: Not on file  Physical Activity: Not on file  Stress: Not on file  Social Connections: Not on file    Allergies:  Allergies  Allergen Reactions   Aspirin     Metabolic Disorder Labs: Lab Results  Component Value Date   HGBA1C 5.5 05/13/2023   Lab Results  Component Value Date   PROLACTIN 63.3 (H) 05/13/2023   Lab Results  Component Value Date   CHOL 181 05/13/2023   TRIG 187 (H) 05/13/2023    HDL 33 (L) 05/13/2023   CHOLHDL 5.5 (H) 05/13/2023   LDLCALC 115 (H) 05/13/2023   Lab Results  Component Value Date   TSH 0.651 05/13/2023   TSH 0.661 05/23/2018    Therapeutic Level Labs: Lab Results  Component Value Date   LITHIUM 1.1 05/13/2023   LITHIUM 0.17 (L) 05/23/2018   No results found for: "VALPROATE" No results found for: "CBMZ"  Current Medications: Current Outpatient Medications  Medication Sig Dispense Refill   albuterol (PROVENTIL HFA;VENTOLIN HFA) 108 (90 Base) MCG/ACT inhaler Inhale 2 puffs into the lungs every 6 (six) hours as needed for wheezing or shortness of breath. 1 Inhaler 0   cyclobenzaprine (FLEXERIL) 10 MG tablet Take 1 tablet (10 mg total) by mouth 2 (two) times daily as needed for muscle spasms. 20 tablet 0   FLUoxetine (PROZAC) 40 MG capsule Take 1 capsule (40 mg total) by mouth daily. 30 capsule 3   lidocaine (LIDODERM) 5 % Place 1 patch onto the skin daily. Remove & Discard patch within 12 hours or as directed by MD 15 patch 0   lithium carbonate 300 MG capsule TAKE 2 CAPSULES BY MOUTH TWICE A DAY WITH MEALS FOR MOOD CONTROL 120 capsule 3   methocarbamol (ROBAXIN) 500 MG tablet Take 1 tablet (500 mg total) by mouth every 12 (twelve) hours as needed for muscle spasms. 12 tablet 0   omeprazole (PRILOSEC OTC) 20 MG tablet Take 20 mg by mouth 2 (two) times daily.     risperiDONE (RISPERDAL) 3 MG tablet Take 2 tablets (6 mg total) by mouth at bedtime. 60 tablet 3   traZODone (DESYREL) 150 MG tablet Take 1 tablet (150 mg total) by mouth at bedtime as needed for sleep. 30 tablet 3   No current facility-administered medications for this visit.     Musculoskeletal: Strength & Muscle Tone: within normal limits Gait & Station: normal Patient leans: N/A  Psychiatric Specialty Exam: Review of Systems  There were no vitals taken for this visit.There is no height or weight on file to calculate BMI.  General Appearance: Well Groomed  Eye Contact:  Good   Speech:  Clear and Coherent and Normal Rate  Volume:  Normal  Mood:  Euthymic  Affect:  Appropriate and Congruent  Thought Process:  Coherent, Goal Directed and Linear  Orientation:  Full (Time, Place, and Person)  Thought Content: WDL and Logical   Suicidal Thoughts:  No  Homicidal Thoughts:  No  Memory:  Immediate;   Good Recent;   Good Remote;   Good  Judgement:  Good  Insight:  Good  Psychomotor Activity:  Normal  Concentration:  Concentration: Good and Attention Span: Good  Recall:  Good  Fund of Knowledge: Good  Language: Good  Akathisia:  No  Handed:  Right  AIMS (if indicated): done, 1  Assets:  Communication Skills Desire for Improvement Financial Resources/Insurance Housing Social Support  ADL's:  Intact  Cognition: WNL  Sleep:  Fair   Screenings: AIMS    Flowsheet Row Clinical Support from 05/13/2023 in [facility]  AIMS Total Score 1      GAD-7    Flowsheet Row Clinical Support from 08/10/2023 in [facility] Clinical Support from 05/13/2023 in [facility] Clinical Support from 12/10/2022 in [facility] Video Visit from 11/21/2021 in [facility] Video Visit from 05/30/2021 in [facility]  Total GAD-7 Score 5 1 4 11 1       PHQ2-9    Flowsheet Row Clinical Support from 08/10/2023 in [facility] Clinical Support from 05/13/2023 in [facility] Clinical Support from 12/10/2022 in [facility] Video Visit from 11/21/2021 in [facility] Video Visit from 05/30/2021 in [facility]  PHQ-2 Total Score 1 0 2 2 1   PHQ-9 Total Score 4 0 4 8 4       Flowsheet Row ED from 04/07/2021 in [facility] Emergency Department at [facility]  C-SSRS RISK CATEGORY No  Risk        Assessment and Plan: Patient notes that he is doing well on his current medication regimen but  at times has difficulty falling asleep.Today trazodone 100 mg increased to 150 mg to help him sleep.  He will continue other medications as prescribed.  Provider discussed patient's elevated cholesterol and prolactin levels.  Patient referred to community health and wellness for primary care.  Provider informed patient that his prolactin level will be reassessed in a few months and informed him that if it is elevated he would be referred to endocrinology to rule out pituitary adenoma.  He endorsed understanding and agreed.  Provider did inform patient that Risperdal can elevate his prolactin level.  He endorsed understanding and agreed.   1. Bipolar I disorder (HCC)  Continue- FLUoxetine (PROZAC) 40 MG capsule; Take 1 capsule (40 mg total) by mouth daily.  Dispense: 30 capsule; Refill: 3 Continue- lithium carbonate 300 MG capsule; TAKE 2 CAPSULES BY MOUTH TWICE A DAY WITH MEALS FOR MOOD CONTROL  Dispense: 120 capsule; Refill: 3 Continue- risperiDONE (RISPERDAL) 3 MG tablet; Take 2 tablets (6 mg total) by mouth at bedtime.  Dispense: 60 tablet; Refill: 3 Continue- traZODone (DESYREL) 100 MG tablet; Take 1 tablet (100 mg total) by mouth at bedtime as needed for sleep.  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  Dispense: 30 tablet; Refill: 3   Follow-up in 2.5 months Follow-up with therapy  Shanna Cisco, NP 08/10/2023, 1:20 PM

## 2023-10-19 ENCOUNTER — Ambulatory Visit (INDEPENDENT_AMBULATORY_CARE_PROVIDER_SITE_OTHER): Payer: No Payment, Other | Admitting: Psychiatry

## 2023-10-19 ENCOUNTER — Encounter (HOSPITAL_COMMUNITY): Payer: Self-pay | Admitting: Psychiatry

## 2023-10-19 DIAGNOSIS — F5105 Insomnia due to other mental disorder: Secondary | ICD-10-CM | POA: Diagnosis not present

## 2023-10-19 DIAGNOSIS — F319 Bipolar disorder, unspecified: Secondary | ICD-10-CM

## 2023-10-19 MED ORDER — TRAZODONE HCL 150 MG PO TABS: 150.0000 mg | ORAL_TABLET | Freq: Every evening | ORAL | 3 refills | Status: DC | PRN

## 2023-10-19 MED ORDER — LITHIUM CARBONATE 300 MG PO CAPS: ORAL_CAPSULE | ORAL | 3 refills | Status: DC

## 2023-10-19 MED ORDER — FLUOXETINE HCL 40 MG PO CAPS: 40.0000 mg | ORAL_CAPSULE | Freq: Every day | ORAL | 3 refills | Status: DC

## 2023-10-19 MED ORDER — RISPERIDONE 3 MG PO TABS: 6.0000 mg | ORAL_TABLET | Freq: Every day | ORAL | 3 refills | Status: DC

## 2023-10-19 NOTE — Progress Notes (Signed)
BH MD/PA/NP OP Progress Note  10/19/2023 12:06 PM Charles Ross  MRN:  [id number]  Chief Complaint: " I did not receive and increase in Trazodone"    HPI: 53 year old male seen today for follow up psychiatric evaluation.  He has a psychiatric history of bipolar 1 disorder and insomnia.  He is currently being managed on lithium 600 mg twice daily, Prozac 40 mg daily, trazodone 150 mg nightly, Risperdal 6 mg at bedtime.  Today he notes that his medications are somewhat effective in managing his psychiatric conditions.    Today he is well-groomed, pleasant, cooperative, and engaged in conversation.  He informed Clinical research associate that he did not receive his increase in trazodone and continues to have issues with sleep.  Patient notes that he continues to sleep 6 hours or less.  Provider called patient's pharmacy and was informed that the order was received however patient did not get the increased dose of trazodone.  Provider has not gave a verbal order for trazodone to be increased and sent an electronic orders.  They note that they would fill it today.    Patient reports that he continues to work at [employer] and find enjoyment in his job.   Since his last visit he informed writer that his anxiety and depression continues to be well-managed.  Provider conducted a GAD-7 and patient scored a 0, at his last visit he scored a 5. Provider also conducted PHQ-9 he scored a 9, at his last visit he scored a 4.  He endorsed  appetite.  Today he denies SI/HI/VAH, mania, paranoia.    Today trazodone 100 mg increased to 150 mg to help him sleep.  He will continue other medications as prescribed. No other concerns noted at this time.  Visit Diagnosis:    ICD-10-CM   1. Bipolar I disorder (HCC)  F31.9 lithium carbonate 300 MG capsule    traZODone (DESYREL) 150 MG tablet    risperiDONE (RISPERDAL) 3 MG tablet    FLUoxetine (PROZAC) 40 MG capsule    2. Insomnia due to mental condition  F51.05 traZODone (DESYREL) 150  MG tablet         Past Psychiat wellness ric History: bipolar disorder  Past Medical History:  Past Medical History:  Diagnosis Date   Bipolar 1 disorder (HCC)     Past Surgical History:  Procedure Laterality Date   HIATAL HERNIA REPAIR      Family Psychiatric History: Denies  Family History: No family history on file.  Social History:  Social History   Socioeconomic History   Marital status: Single    Spouse name: Not on file   Number of children: Not on file   Years of education: Not on file   Highest education level: Not on file  Occupational History   Not on file  Tobacco Use   Smoking status: Former   Smokeless tobacco: Never  Substance and Sexual Activity   Alcohol use: Yes    Comment: occasional   Drug use: No   Sexual activity: Not on file  Other Topics Concern   Not on file  Social History Narrative   Not on file   Social Drivers of Health   Financial Resource Strain: Not on file  Food Insecurity: Not on file  Transportation Needs: Not on file  Physical Activity: Not on file  Stress: Not on file  Social Connections: Not on file    Allergies:  Allergies  Allergen Reactions   Aspirin  Metabolic Disorder Labs: Lab Results  Component Value Date   HGBA1C 5.5 05/13/2023   Lab Results  Component Value Date   PROLACTIN 63.3 (H) 05/13/2023   Lab Results  Component Value Date   CHOL 181 05/13/2023   TRIG 187 (H) 05/13/2023   HDL 33 (L) 05/13/2023   CHOLHDL 5.5 (H) 05/13/2023   LDLCALC 115 (H) 05/13/2023   Lab Results  Component Value Date   TSH 0.651 05/13/2023   TSH 0.661 05/23/2018    Therapeutic Level Labs: Lab Results  Component Value Date   LITHIUM 1.1 05/13/2023   LITHIUM 0.17 (L) 05/23/2018   No results found for: "VALPROATE" No results found for: "CBMZ"  Current Medications: Current Outpatient Medications  Medication Sig Dispense Refill   albuterol (PROVENTIL HFA;VENTOLIN HFA) 108 (90 Base) MCG/ACT inhaler  Inhale 2 puffs into the lungs every 6 (six) hours as needed for wheezing or shortness of breath. 1 Inhaler 0   cyclobenzaprine (FLEXERIL) 10 MG tablet Take 1 tablet (10 mg total) by mouth 2 (two) times daily as needed for muscle spasms. 20 tablet 0   FLUoxetine (PROZAC) 40 MG capsule Take 1 capsule (40 mg total) by mouth daily. 30 capsule 3   lidocaine (LIDODERM) 5 % Place 1 patch onto the skin daily. Remove & Discard patch within 12 hours or as directed by MD 15 patch 0   lithium carbonate 300 MG capsule TAKE 2 CAPSULES BY MOUTH TWICE A DAY WITH MEALS FOR MOOD CONTROL 120 capsule 3   methocarbamol (ROBAXIN) 500 MG tablet Take 1 tablet (500 mg total) by mouth every 12 (twelve) hours as needed for muscle spasms. 12 tablet 0   omeprazole (PRILOSEC OTC) 20 MG tablet Take 20 mg by mouth 2 (two) times daily.     risperiDONE (RISPERDAL) 3 MG tablet Take 2 tablets (6 mg total) by mouth at bedtime. 60 tablet 3   traZODone (DESYREL) 150 MG tablet Take 1 tablet (150 mg total) by mouth at bedtime as needed for sleep. 30 tablet 3   No current facility-administered medications for this visit.     Musculoskeletal: Strength & Muscle Tone: within normal limits Gait & Station: normal Patient leans: N/A  Psychiatric Specialty Exam: Review of Systems  Blood pressure 118/77, pulse 81, temperature 98 F (36.7 C), weight 182 lb (82.6 kg), SpO2 99%.Body mass index is 24.01 kg/m.  General Appearance: Well Groomed  Eye Contact:  Good  Speech:  Clear and Coherent and Normal Rate  Volume:  Normal  Mood:  Euthymic  Affect:  Appropriate and Congruent  Thought Process:  Coherent, Goal Directed and Linear  Orientation:  Full (Time, Place, and Person)  Thought Content: WDL and Logical   Suicidal Thoughts:  No  Homicidal Thoughts:  No  Memory:  Immediate;   Good Recent;   Good Remote;   Good  Judgement:  Good  Insight:  Good  Psychomotor Activity:  Normal  Concentration:  Concentration: Good and Attention  Span: Good  Recall:  Good  Fund of Knowledge: Good  Language: Good  Akathisia:  No  Handed:  Right  AIMS (if indicated): not done  Assets:  Communication Skills Desire for Improvement Financial Resources/Insurance Housing Social Support  ADL's:  Intact  Cognition: WNL  Sleep:  Fair   Screenings: AIMS    Flowsheet Row Clinical Support from 08/10/2023 in [facility] Clinical Support from 05/13/2023 in [facility]  AIMS Total Score 1 1  GAD-7    Flowsheet Row Clinical Support from 10/19/2023 in [facility] Clinical Support from 08/10/2023 in [facility] Clinical Support from 05/13/2023 in [facility] Clinical Support from 12/10/2022 in [facility] Video Visit from 11/21/2021 in [facility]  Total GAD-7 Score 0 5 1 4 11       PHQ2-9    Flowsheet Row Clinical Support from 10/19/2023 in [facility] Clinical Support from 08/10/2023 in [facility] Clinical Support from 05/13/2023 in [facility] Clinical Support from 12/10/2022 in [facility] Video Visit from 11/21/2021 in [facility]  PHQ-2 Total Score 3 1 0 2 2  PHQ-9 Total Score 9 4 0 4 8      Flowsheet Row ED from 04/07/2021 in [facility] Emergency Department at [facility]  C-SSRS RISK CATEGORY No Risk        Assessment and Plan: Patient notes that he is doing well on his current medication regimen but  at times has difficulty falling asleep.he did not receive increased dose of trazodone. Today trazodone 100 mg increased to 150 mg to help him sleep.  He will continue other medications as prescribed.   1. Bipolar I disorder (HCC)  Continue- lithium carbonate 300 MG capsule;  TAKE 2 CAPSULES BY MOUTH TWICE A DAY WITH MEALS FOR MOOD CONTROL  Dispense: 120 capsule; Refill: 3 Increased- traZODone (DESYREL) 150 MG tablet; Take 1 tablet (150 mg total) by mouth at bedtime as needed for sleep.  Dispense: 30 tablet; Refill: 3 Continue- risperiDONE (RISPERDAL) 3 MG tablet; Take 2 tablets (6 mg total) by mouth at bedtime.  Dispense: 60 tablet; Refill: 3 -Continue FLUoxetine (PROZAC) 40 MG capsule; Take 1 capsule (40 mg total) by mouth daily.  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  Dispense: 30 tablet; Refill: 3    Follow-up in 2.5 months Follow-up with therapy  Shanna Cisco, NP 10/19/2023, 12:06 PM

## 2023-12-28 ENCOUNTER — Encounter (HOSPITAL_COMMUNITY): Payer: No Payment, Other | Admitting: Psychiatry

## 2024-01-10 ENCOUNTER — Ambulatory Visit (INDEPENDENT_AMBULATORY_CARE_PROVIDER_SITE_OTHER): Admitting: Psychiatry

## 2024-01-10 ENCOUNTER — Encounter (HOSPITAL_COMMUNITY): Payer: Self-pay | Admitting: Psychiatry

## 2024-01-10 VITALS — BP 122/71 | HR 88 | Temp 98.4°F | Ht 73.0 in | Wt 175.4 lb

## 2024-01-10 DIAGNOSIS — F5105 Insomnia due to other mental disorder: Secondary | ICD-10-CM

## 2024-01-10 DIAGNOSIS — F319 Bipolar disorder, unspecified: Secondary | ICD-10-CM

## 2024-01-10 MED ORDER — RISPERIDONE 3 MG PO TABS: 6.0000 mg | ORAL_TABLET | Freq: Every day | ORAL | 3 refills | Status: DC

## 2024-01-10 MED ORDER — TRAZODONE HCL 150 MG PO TABS: 150.0000 mg | ORAL_TABLET | Freq: Every evening | ORAL | 3 refills | Status: DC | PRN

## 2024-01-10 MED ORDER — LITHIUM CARBONATE 300 MG PO CAPS: ORAL_CAPSULE | ORAL | 3 refills | Status: DC

## 2024-01-10 MED ORDER — FLUOXETINE HCL 40 MG PO CAPS: 40.0000 mg | ORAL_CAPSULE | Freq: Every day | ORAL | 3 refills | Status: DC

## 2024-01-10 NOTE — Progress Notes (Signed)
 BH MD/PA/NP OP Progress Note  01/10/2024 11:11 AM Charles Ross  MRN:  [id number]  Chief Complaint: "I have been sleeping better"    HPI: 53 year old male seen today for follow up psychiatric evaluation.  He has a psychiatric history of bipolar 1 disorder and insomnia.  He is currently being managed on lithium 600 mg twice daily, Prozac 40 mg daily, trazodone 150 mg nightly, Risperdal 6 mg at bedtime.  Today he notes that his medications are effective in managing his psychiatric conditions.    Today he is well-groomed, pleasant, cooperative, and engaged in conversation.  He informed Clinical research associate that since increasing trazodone he has been sleeping better.  Patient notes that his mood is stable and notes that he has minimal anxiety depression.  Today provider conducted a GAD-7 and patient scored a 3, at his last visit he scored a 0. Provider also conducted PHQ-9 he scored a 0, at his last visit he scored a 9.  He endorsed  sleep and appetite.  Today he denies SI/HI/VAH, mania, paranoia.    Patient has not had labs in 6 months.  Today provider ordered lithium level, LFT, thyroid panel, HgbA1c, lipid panel, CBC, CMP, HgbA1c, and prolactin level.  No medication changes made today.  Patient agreeable to continue medication prescribed.  No other concerns noted at this time.  Visit Diagnosis:    ICD-10-CM   1. 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  Social History:  Social History   Socioeconomic History   Marital status: Single    Spouse name: Not on file   Number of children: Not on file   Years of education: Not on file   Highest education level: Not on file  Occupational History   Not on file  Tobacco Use   Smoking status: Former   Smokeless tobacco: Never  Substance and Sexual Activity   Alcohol use: Yes    Comment: occasional   Drug use: No   Sexual activity: Not on file  Other Topics Concern   Not on file  Social History Narrative   Not on file   Social Drivers of Health   Financial Resource Strain: Not on file  Food Insecurity: Not on file  Transportation Needs: Not on file  Physical Activity: Not on file  Stress: Not on file  Social Connections: Not on file    Allergies:  Allergies  Allergen Reactions   Aspirin     Metabolic Disorder Labs: Lab Results  Component Value Date   HGBA1C 5.5 05/13/2023   Lab Results  Component Value Date   PROLACTIN 63.3 (H) 05/13/2023   Lab Results  Component Value  Date   CHOL 181 05/13/2023   TRIG 187 (H) 05/13/2023   HDL 33 (L) 05/13/2023   CHOLHDL 5.5 (H) 05/13/2023   LDLCALC 115 (H) 05/13/2023   Lab Results  Component Value Date   TSH 0.651 05/13/2023   TSH 0.661 05/23/2018    Therapeutic Level Labs: Lab Results  Component Value Date   LITHIUM 1.1 05/13/2023   LITHIUM 0.17 (L) 05/23/2018   No results found for: "VALPROATE" No results found for: "CBMZ"  Current Medications: Current Outpatient Medications  Medication Sig Dispense Refill   albuterol (PROVENTIL HFA;VENTOLIN HFA) 108 (90 Base) MCG/ACT inhaler Inhale 2 puffs into the lungs every 6 (six) hours as needed for wheezing or shortness of breath. 1 Inhaler 0   cyclobenzaprine (FLEXERIL) 10 MG tablet Take 1 tablet (10 mg total) by mouth 2 (two) times daily as  needed for muscle spasms. 20 tablet 0   FLUoxetine (PROZAC) 40 MG capsule Take 1 capsule (40 mg total) by mouth daily. 30 capsule 3   lidocaine (LIDODERM) 5 % Place 1 patch onto the skin daily. Remove & Discard patch within 12 hours or as directed by MD 15 patch 0   lithium carbonate 300 MG capsule TAKE 2 CAPSULES BY MOUTH TWICE A DAY WITH MEALS FOR MOOD CONTROL 120 capsule 3   methocarbamol (ROBAXIN) 500 MG tablet Take 1 tablet (500 mg total) by mouth every 12 (twelve) hours as needed for muscle spasms. 12 tablet 0   omeprazole (PRILOSEC OTC) 20 MG tablet Take 20 mg by mouth 2 (two) times daily.     risperiDONE (RISPERDAL) 3 MG tablet Take 2 tablets (6 mg total) by mouth at bedtime. 60 tablet 3   traZODone (DESYREL) 150 MG tablet Take 1 tablet (150 mg total) by mouth at bedtime as needed for sleep. 30 tablet 3   No current facility-administered medications for this visit.     Musculoskeletal: Strength & Muscle Tone: within normal limits Gait & Station: normal Patient leans: N/A  Psychiatric Specialty Exam: Review of Systems  Blood pressure 122/71, pulse 88, temperature 98.4 F (36.9 C), height 6\' 1"  (1.854 m), weight 175 lb 6.4 oz (79.6 kg), SpO2 98%.Body mass index is 23.14 kg/m.  General Appearance: Well Groomed  Eye Contact:  Good  Speech:  Clear and Coherent and Normal Rate  Volume:  Normal  Mood:  Euthymic  Affect:  Appropriate and Congruent  Thought Process:  Coherent, Goal Directed and Linear  Orientation:  Full (Time, Place, and Person)  Thought Content: WDL and Logical   Suicidal Thoughts:  No  Homicidal Thoughts:  No  Memory:  Immediate;   Good Recent;   Good Remote;   Good  Judgement:  Good  Insight:  Good  Psychomotor Activity:  Normal  Concentration:  Concentration: Good and Attention Span: Good  Recall:  Good  Fund of Knowledge: Good  Language: Good  Akathisia:  No  Handed:  Right  AIMS (if indicated): not done  Assets:  Communication Skills Desire for  Improvement Financial Resources/Insurance Housing Social Support  ADL's:  Intact  Cognition: WNL  Sleep:  Good   Screenings: AIMS    Flowsheet Row Clinical Support from 08/10/2023 in [facility] Clinical Support from 05/13/2023 in [facility]  AIMS Total Score 1 1      GAD-7    Flowsheet Row Clinical Support from 01/10/2024 in [facility] Clinical Support from 10/19/2023 in [facility]  Clinical Support from 08/10/2023 in [facility] Clinical Support from 05/13/2023 in [facility] Clinical Support from 12/10/2022 in [facility]  Total GAD-7 Score 3 0 5 1 4       PHQ2-9    Flowsheet Row Clinical Support from 01/10/2024 in [facility] Clinical Support from 10/19/2023 in [facility] Clinical Support from 08/10/2023 in [facility] Clinical Support from 05/13/2023 in [facility] Clinical Support from 12/10/2022 in [facility]  PHQ-2 Total Score 0 3 1 0 2  PHQ-9 Total Score 0 9 4 0 4      Flowsheet Row ED from 04/07/2021 in [facility] Emergency Department at [facility]  C-SSRS RISK CATEGORY No Risk        Assessment and Plan: Patient notes that his sleep has improved since increasing trazodone.Patient has not had labs in 6 months.  Today provider ordered lithium level, LFT, thyroid panel, HgbA1c, lipid panel, CBC, CMP, HgbA1c, and prolactin level.  No medication changes made today.  Patient agreeable to continue medication prescribed  1. Bipolar I disorder (HCC) (Primary)  - Prolactin; Future - Comprehensive Metabolic Panel (CMET); Future - CBC w/Diff/Platelet; Future - Lithium level; Future - HgB A1c; Future - Hepatic function panel;  Future - Thyroid Panel With TSH; Future Continue- lithium carbonate 300 MG capsule; TAKE 2 CAPSULES BY MOUTH TWICE A DAY WITH MEALS FOR MOOD CONTROL  Dispense: 120 capsule; Refill: 3 Continue- FLUoxetine (PROZAC) 40 MG capsule; Take 1 capsule (40 mg total) by mouth daily.  Dispense: 30 capsule; Refill: 3 Continue- risperiDONE (RISPERDAL) 3 MG tablet; Take 2 tablets (6 mg total) by mouth at bedtime.  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  Dispense: 30 tablet; Refill: 3   Follow-up in 2.5 months Follow-up with therapy  Arlyne Bering, NP 01/10/2024, 11:11 AM

## 2024-01-26 ENCOUNTER — Other Ambulatory Visit (HOSPITAL_COMMUNITY)

## 2024-01-26 DIAGNOSIS — F319 Bipolar disorder, unspecified: Secondary | ICD-10-CM

## 2024-01-26 DIAGNOSIS — Z79899 Other long term (current) drug therapy: Secondary | ICD-10-CM | POA: Diagnosis not present

## 2024-01-26 NOTE — Progress Notes (Signed)
 Pt tolerated labs successfully in right arm with no complaints.     JNL, CMA

## 2024-01-27 LAB — LIPID PANEL
Chol/HDL Ratio: 4.4 ratio (ref 0.0–5.0)
Cholesterol, Total: 171 mg/dL (ref 100–199)
HDL: 39 mg/dL — ABNORMAL LOW (ref >39)
LDL Chol Calc (NIH): 104 mg/dL — ABNORMAL HIGH (ref 0–99)
Triglycerides: 159 mg/dL — ABNORMAL HIGH (ref 0–149)
VLDL Cholesterol Cal: 28 mg/dL (ref 5–40)

## 2024-01-27 LAB — CBC WITH DIFFERENTIAL/PLATELET
Basophils Absolute: 0.1 10*3/uL (ref 0.0–0.2)
Basos: 1 %
EOS (ABSOLUTE): 0.2 10*3/uL (ref 0.0–0.4)
Eos: 2 %
Hematocrit: 38.6 % (ref 37.5–51.0)
Hemoglobin: 13.3 g/dL (ref 13.0–17.7)
Immature Grans (Abs): 0.1 10*3/uL (ref 0.0–0.1)
Immature Granulocytes: 1 %
Lymphocytes Absolute: 1.9 10*3/uL (ref 0.7–3.1)
Lymphs: 18 %
MCH: 32 pg (ref 26.6–33.0)
MCHC: 34.5 g/dL (ref 31.5–35.7)
MCV: 93 fL (ref 79–97)
Monocytes Absolute: 0.7 10*3/uL (ref 0.1–0.9)
Monocytes: 7 %
Neutrophils Absolute: 7.9 10*3/uL — ABNORMAL HIGH (ref 1.4–7.0)
Neutrophils: 71 %
Platelets: 236 10*3/uL (ref 150–450)
RBC: 4.16 x10E6/uL (ref 4.14–5.80)
RDW: 12.7 % (ref 11.6–15.4)
WBC: 10.9 10*3/uL — ABNORMAL HIGH (ref 3.4–10.8)

## 2024-01-27 LAB — HEPATIC FUNCTION PANEL: Bilirubin, Direct: 0.09 mg/dL (ref 0.00–0.40)

## 2024-01-27 LAB — COMPREHENSIVE METABOLIC PANEL WITH GFR
ALT: 15 IU/L (ref 0–44)
AST: 8 IU/L (ref 0–40)
Albumin: 4.5 g/dL (ref 3.8–4.9)
Alkaline Phosphatase: 62 IU/L (ref 44–121)
BUN/Creatinine Ratio: 11 (ref 9–20)
BUN: 11 mg/dL (ref 6–24)
Bilirubin Total: <0.2 mg/dL (ref 0.0–1.2)
CO2: 21 mmol/L (ref 20–29)
Calcium: 9.9 mg/dL (ref 8.7–10.2)
Chloride: 107 mmol/L — ABNORMAL HIGH (ref 96–106)
Creatinine, Ser: 1 mg/dL (ref 0.76–1.27)
Globulin, Total: 1.9 g/dL (ref 1.5–4.5)
Glucose: 100 mg/dL — ABNORMAL HIGH (ref 70–99)
Potassium: 4.1 mmol/L (ref 3.5–5.2)
Sodium: 140 mmol/L (ref 134–144)
Total Protein: 6.4 g/dL (ref 6.0–8.5)
eGFR: 90 mL/min/{1.73_m2} (ref >59)

## 2024-01-27 LAB — PROLACTIN: Prolactin: 50.9 ng/mL — ABNORMAL HIGH (ref 3.6–25.2)

## 2024-01-27 LAB — HEMOGLOBIN A1C
Est. average glucose Bld gHb Est-mCnc: 100 mg/dL
Hgb A1c MFr Bld: 5.1 % (ref 4.8–5.6)

## 2024-01-27 LAB — LITHIUM LEVEL: Lithium Lvl: 0.7 mmol/L (ref 0.5–1.2)

## 2024-01-27 LAB — THYROID PANEL WITH TSH
Free Thyroxine Index: 1.5 (ref 1.2–4.9)
T3 Uptake Ratio: 24 % (ref 24–39)
T4, Total: 6.3 ug/dL (ref 4.5–12.0)
TSH: 0.894 u[IU]/mL (ref 0.450–4.500)

## 2024-01-27 NOTE — Progress Notes (Signed)
 Provider called to discuss patient recent lab values.  At this time he has no questions or concerns.

## 2024-02-29 ENCOUNTER — Encounter (HOSPITAL_COMMUNITY): Admitting: Psychiatry

## 2024-09-08 ENCOUNTER — Telehealth (HOSPITAL_COMMUNITY): Payer: Self-pay | Admitting: Psychiatry

## 2024-09-11 NOTE — Telephone Encounter (Signed)
 Patient needs appointment prior to refills.  Provider instructed practice staff to call the patient and schedule him for an appointment.

## 2024-09-12 ENCOUNTER — Other Ambulatory Visit (HOSPITAL_COMMUNITY): Payer: Self-pay | Admitting: Psychiatry

## 2024-09-12 DIAGNOSIS — F5105 Insomnia due to other mental disorder: Secondary | ICD-10-CM

## 2024-09-12 MED ORDER — TRAZODONE HCL 150 MG PO TABS: 150.0000 mg | ORAL_TABLET | Freq: Every evening | ORAL | 3 refills | Status: DC | PRN

## 2024-09-12 NOTE — Telephone Encounter (Signed)
 Message left on VM from patient requesting a rx for his Trazodone . He has not been seen since April, Dr Harl has a note he needs to be seen before rx will be written. He has a new appt on 09/26/24. I will forward this to her to see if she is willing to bridge him meds till his appt.

## 2024-09-13 ENCOUNTER — Telehealth (HOSPITAL_COMMUNITY): Payer: Self-pay | Admitting: Psychiatry

## 2024-09-13 NOTE — Telephone Encounter (Signed)
 He would like you to call in a script for Trazadone to [facility]. He sees you on 09/26/2024.

## 2024-09-13 NOTE — Telephone Encounter (Signed)
 Patient provided a bridge of trazodone  until his next appointment.

## 2024-09-13 NOTE — Telephone Encounter (Signed)
 Medication sent to preferred pharmacy

## 2024-09-26 ENCOUNTER — Encounter (HOSPITAL_COMMUNITY): Payer: Self-pay | Admitting: Psychiatry

## 2024-09-26 ENCOUNTER — Ambulatory Visit (INDEPENDENT_AMBULATORY_CARE_PROVIDER_SITE_OTHER): Admitting: Psychiatry

## 2024-09-26 DIAGNOSIS — F5105 Insomnia due to other mental disorder: Secondary | ICD-10-CM

## 2024-09-26 DIAGNOSIS — F319 Bipolar disorder, unspecified: Secondary | ICD-10-CM | POA: Diagnosis not present

## 2024-09-26 MED ORDER — FLUOXETINE HCL 40 MG PO CAPS: 40.0000 mg | ORAL_CAPSULE | Freq: Every day | ORAL | 3 refills | Status: DC

## 2024-09-26 MED ORDER — TRAZODONE HCL 150 MG PO TABS: 150.0000 mg | ORAL_TABLET | Freq: Every evening | ORAL | 3 refills | Status: AC | PRN

## 2024-09-26 MED ORDER — RISPERIDONE 3 MG PO TABS: 6.0000 mg | ORAL_TABLET | Freq: Every day | ORAL | 3 refills | Status: DC

## 2024-09-26 MED ORDER — LITHIUM CARBONATE 300 MG PO CAPS: ORAL_CAPSULE | ORAL | 3 refills | Status: DC

## 2024-09-26 NOTE — Progress Notes (Signed)
 [facility] MD/PA/NP OP Progress Note  09/26/2024 3:15 PM Charles Ross  MRN:  [id number]  Chief Complaint: I am doing well    HPI: 53 year old male seen today for follow up psychiatric evaluation.  He has a psychiatric history of bipolar 1 disorder and insomnia.  He is currently being managed on lithium  600 mg twice daily, Prozac  40 mg daily, trazodone  150 mg nightly, Risperdal  6 mg at bedtime.  Today he notes that his medications are effective in managing his psychiatric conditions.    Today he is well-groomed, pleasant, cooperative, and engaged in conversation.  He informed clinical research associate that overall he is doing well.  Patient notes that he continues to work at [employer].  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   At his last visit he rated his anxiety, depression, and mood are well-managed.  Today provider conducted a GAD-7 and patient scored a 0. Provider also conducted PHQ-9 he scored a 0.  He endorsed  sleep and appetite.  Today he denies SI/HI/VAH, mania, paranoia.    Today provider conducted the aims assessment.  Patient scored a 0.  Patient has not had labs in 6 months.  Today provider ordered lithium  level, LFT, thyroid  panel, HgbA1c, lipid panel, CBC, CMP, HgbA1c, Vit D, Vit B, and prolactin level.  No medication changes made today.  Patient agreeable to continue medication prescribed.  No other concerns noted at this time.  Visit Diagnosis:    ICD-10-CM   1. 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  Social History:  Social History   Socioeconomic History   Marital status: Single    Spouse name: Not on file   Number of children: Not on file   Years of education: Not on file   Highest education level: Not on file  Occupational History   Not on file  Tobacco Use   Smoking status: Former   Smokeless tobacco: Never  Substance and Sexual Activity   Alcohol use: Yes    Comment: occasional   Drug use: No   Sexual activity: Not on file  Other Topics Concern   Not on file  Social History Narrative   Not on file   Social Drivers of Health   Tobacco Use: Medium Risk (01/10/2024)   Patient History    Smoking Tobacco Use: Former    Smokeless Tobacco Use: Never    Passive Exposure: Not on Actuary Strain: Not on file  Food Insecurity: Not on  file  Transportation Needs: Not on file  Physical Activity: Not on file  Stress: Not on file  Social Connections: Not on file  Depression (PHQ2-9): Low Risk (09/26/2024)   Depression (PHQ2-9)    PHQ-2 Score: 0  Alcohol Screen: Not on file  Housing: Not on file  Utilities: Not on file  Health Literacy: Not on file    Allergies:  Allergies  Allergen Reactions   Aspirin     Metabolic Disorder Labs: Lab Results  Component Value Date   HGBA1C 5.1 01/26/2024   Lab Results  Component Value Date   PROLACTIN 50.9 (H) 01/26/2024   PROLACTIN 63.3 (H) 05/13/2023   Lab Results  Component Value Date   CHOL 171 01/26/2024   TRIG 159 (H) 01/26/2024   HDL 39 (L) 01/26/2024   CHOLHDL 4.4 01/26/2024   LDLCALC 104 (H) 01/26/2024   LDLCALC 115 (H) 05/13/2023   Lab Results  Component Value Date    TSH 0.894 01/26/2024   TSH 0.651 05/13/2023    Therapeutic Level Labs: Lab Results  Component Value Date   LITHIUM  0.7 01/26/2024   LITHIUM  1.1 05/13/2023   No results found for: VALPROATE No results found for: CBMZ  Current Medications: Current Outpatient Medications  Medication Sig Dispense Refill   albuterol  (PROVENTIL  HFA;VENTOLIN  HFA) 108 (90 Base) MCG/ACT inhaler Inhale 2 puffs into the lungs every 6 (six) hours as needed for wheezing or shortness of breath. 1 Inhaler 0   cyclobenzaprine  (FLEXERIL ) 10 MG tablet Take 1 tablet (10 mg total) by mouth 2 (two) times daily as needed for muscle spasms. 20 tablet 0   FLUoxetine  (PROZAC ) 40 MG capsule Take 1 capsule (40 mg total) by mouth daily. 30 capsule 3   lidocaine  (LIDODERM ) 5 % Place 1 patch onto the skin daily. Remove & Discard patch within 12 hours or as directed by MD 15 patch 0   lithium  carbonate 300 MG capsule TAKE 2 CAPSULES BY MOUTH TWICE A DAY WITH MEALS FOR MOOD CONTROL 120 capsule 3   methocarbamol  (ROBAXIN ) 500 MG tablet Take 1 tablet (500 mg total) by mouth every 12 (twelve) hours as needed for muscle spasms. 12 tablet 0   omeprazole (PRILOSEC OTC) 20 MG tablet Take 20 mg by mouth 2 (two) times daily.     risperiDONE  (RISPERDAL ) 3 MG tablet Take 2 tablets (6 mg total) by mouth at bedtime. 60 tablet 3   traZODone  (DESYREL ) 150 MG tablet Take 1 tablet (150 mg total) by mouth at bedtime as needed for sleep. 30 tablet 3   No current facility-administered medications for this visit.     Musculoskeletal: Strength & Muscle Tone: within normal limits Gait & Station: normal Patient leans: N/A  Psychiatric Specialty Exam: Review of Systems  Blood pressure 112/70, pulse 73, temperature 97.9 F (36.6 C), height 6' 1 (1.854 m), weight 180 lb (81.6 kg), SpO2 99%.Body mass index is 23.75 kg/m.  General Appearance: Well Groomed  Eye Contact:  Good  Speech:  Clear and Coherent and Normal Rate  Volume:  Normal   Mood:  Euthymic  Affect:  Appropriate and Congruent  Thought Process:  Coherent, Goal Directed and Linear  Orientation:  Full (Time, Place, and Person)  Thought Content: WDL and Logical   Suicidal Thoughts:  No  Homicidal Thoughts:  No  Memory:  Immediate;   Good Recent;   Good Remote;   Good  Judgement:  Good  Insight:  Good  Psychomotor Activity:  Normal  Concentration:  Concentration:  Good and Attention Span: Good  Recall:  Good  Fund of Knowledge: Good  Language: Good  Akathisia:  No  Handed:  Right  AIMS (if indicated): not done  Assets:  Communication Skills Desire for Improvement Financial Resources/Insurance Housing Social Support  ADL's:  Intact  Cognition: WNL  Sleep:  Good   Screenings: AIMS    Flowsheet Row Clinical Support from 08/10/2023 in [facility] Clinical Support from 05/13/2023 in [facility]  AIMS Total Score 1 1   GAD-7    Flowsheet Row Clinical Support from 09/26/2024 in [facility] Clinical Support from 01/10/2024 in [facility] Clinical Support from 10/19/2023 in [facility] Clinical Support from 08/10/2023 in [facility] Clinical Support from 05/13/2023 in [facility]  Total GAD-7 Score 0 3 0 5 1   PHQ2-9    Flowsheet Row Clinical Support from 09/26/2024 in [facility] Clinical Support from 01/10/2024 in [facility] Clinical Support from 10/19/2023 in [facility] Clinical Support from 08/10/2023 in [facility] Clinical Support from 05/13/2023 in [facility]  PHQ-2 Total Score 0 0 3 1 0  PHQ-9 Total Score 0 0 9 4 0   Flowsheet Row ED from 04/07/2021 in [facility] Emergency Department at [facility]   C-SSRS RISK CATEGORY No Risk     Assessment and Plan: Patient notes that his mood, anxiety, and depression are well managed. Patient has not had labs in 6 months.  Today provider ordered lithium  level, LFT, thyroid  panel, HgbA1c, lipid panel, CBC, CMP, HgbA1c, Vit D, Vit B, and prolactin level.  No medication changes made today.  Patient agreeable to continue medication prescribed.  1. Bipolar I disorder (HCC)  - CBC w/Diff/Platelet; Future - Comprehensive Metabolic Panel (CMET); Future - Lipid panel; Future - Thyroid  Panel With TSH; Future - Hepatic function panel; Future - Prolactin; Future - HgB A1c; Future - Vitamin D (25 hydroxy); Future - Vitamin B12; Future Continue- lithium  carbonate 300 MG capsule; TAKE 2 CAPSULES BY MOUTH TWICE A DAY WITH MEALS FOR MOOD CONTROL  Dispense: 120 capsule; Refill: 3 Continue- FLUoxetine  (PROZAC ) 40 MG capsule; Take 1 capsule (40 mg total) by mouth daily.  Dispense: 30 capsule; Refill: 3 Continue- risperiDONE  (RISPERDAL ) 3 MG tablet; Take 2 tablets (6 mg total) by mouth at bedtime.  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  Dispense: 30 tablet; Refill: 3   Follow-up in 2.5 months Follow-up with therapy  Zane FORBES Bach, NP 09/26/2024, 3:15 PM

## 2024-10-20 ENCOUNTER — Other Ambulatory Visit (HOSPITAL_COMMUNITY): Payer: Self-pay | Admitting: Psychiatry

## 2024-10-20 DIAGNOSIS — F319 Bipolar disorder, unspecified: Secondary | ICD-10-CM

## 2024-12-04 ENCOUNTER — Other Ambulatory Visit (HOSPITAL_COMMUNITY)

## 2024-12-14 ENCOUNTER — Encounter (HOSPITAL_COMMUNITY): Admitting: Psychiatry
